# Patient Record
Sex: Female | Born: 1945 | Race: White | Hispanic: No | Marital: Married | Smoking: Former smoker
Health system: Southern US, Community
[De-identification: ages and names within clinical notes are randomized; demographics above are authoritative.]

## PROBLEM LIST (undated history)

## (undated) DIAGNOSIS — F419 Anxiety disorder, unspecified: Secondary | ICD-10-CM

## (undated) DIAGNOSIS — Z789 Other specified health status: Secondary | ICD-10-CM

## (undated) DIAGNOSIS — Z872 Personal history of diseases of the skin and subcutaneous tissue: Secondary | ICD-10-CM

## (undated) DIAGNOSIS — N63 Unspecified lump in unspecified breast: Secondary | ICD-10-CM

## (undated) DIAGNOSIS — N816 Rectocele: Secondary | ICD-10-CM

## (undated) DIAGNOSIS — K219 Gastro-esophageal reflux disease without esophagitis: Secondary | ICD-10-CM

## (undated) DIAGNOSIS — Z8489 Family history of other specified conditions: Secondary | ICD-10-CM

## (undated) DIAGNOSIS — G5600 Carpal tunnel syndrome, unspecified upper limb: Secondary | ICD-10-CM

## (undated) DIAGNOSIS — H269 Unspecified cataract: Secondary | ICD-10-CM

## (undated) DIAGNOSIS — Z1211 Encounter for screening for malignant neoplasm of colon: Secondary | ICD-10-CM

## (undated) DIAGNOSIS — Z87440 Personal history of urinary (tract) infections: Secondary | ICD-10-CM

## (undated) DIAGNOSIS — S0300XA Dislocation of jaw, unspecified side, initial encounter: Secondary | ICD-10-CM

## (undated) DIAGNOSIS — M1711 Unilateral primary osteoarthritis, right knee: Secondary | ICD-10-CM

## (undated) DIAGNOSIS — Z87891 Personal history of nicotine dependence: Secondary | ICD-10-CM

## (undated) DIAGNOSIS — E079 Disorder of thyroid, unspecified: Secondary | ICD-10-CM

## (undated) DIAGNOSIS — Z1389 Encounter for screening for other disorder: Secondary | ICD-10-CM

## (undated) HISTORY — DX: Personal history of diseases of the skin and subcutaneous tissue: Z87.2

## (undated) HISTORY — DX: Carpal tunnel syndrome, unspecified upper limb: G56.00

## (undated) HISTORY — DX: Encounter for screening for malignant neoplasm of colon: Z12.11

## (undated) HISTORY — DX: Personal history of nicotine dependence: Z87.891

## (undated) HISTORY — DX: Unspecified lump in unspecified breast: N63.0

## (undated) HISTORY — DX: Disorder of thyroid, unspecified: E07.9

## (undated) HISTORY — DX: Encounter for screening for other disorder: Z13.89

## (undated) HISTORY — DX: Personal history of urinary (tract) infections: Z87.440

---

## 1981-01-18 HISTORY — PX: DILATION AND CURETTAGE OF UTERUS: SHX78

## 1998-01-18 HISTORY — PX: CHOLECYSTECTOMY: SHX55

## 2004-01-19 DIAGNOSIS — G5600 Carpal tunnel syndrome, unspecified upper limb: Secondary | ICD-10-CM

## 2004-01-19 HISTORY — DX: Carpal tunnel syndrome, unspecified upper limb: G56.00

## 2004-01-19 HISTORY — PX: CARPAL TUNNEL RELEASE: SHX101

## 2008-01-19 DIAGNOSIS — N63 Unspecified lump in unspecified breast: Secondary | ICD-10-CM

## 2008-01-19 DIAGNOSIS — Z872 Personal history of diseases of the skin and subcutaneous tissue: Secondary | ICD-10-CM

## 2008-01-19 HISTORY — DX: Personal history of diseases of the skin and subcutaneous tissue: Z87.2

## 2008-01-19 HISTORY — PX: INCISION AND DRAINAGE BREAST ABSCESS: SUR672

## 2008-01-19 HISTORY — DX: Unspecified lump in unspecified breast: N63.0

## 2008-01-19 HISTORY — PX: TUBAL LIGATION: SHX77

## 2009-01-18 HISTORY — PX: COLONOSCOPY: SHX174

## 2010-01-18 DIAGNOSIS — E079 Disorder of thyroid, unspecified: Secondary | ICD-10-CM

## 2010-01-18 HISTORY — DX: Disorder of thyroid, unspecified: E07.9

## 2010-07-28 ENCOUNTER — Ambulatory Visit: Payer: Self-pay | Admitting: Gastroenterology

## 2011-07-07 ENCOUNTER — Emergency Department: Payer: Self-pay | Admitting: *Deleted

## 2011-07-07 LAB — COMPREHENSIVE METABOLIC PANEL
Albumin: 3.9 g/dL (ref 3.4–5.0)
Alkaline Phosphatase: 67 U/L (ref 50–136)
Anion Gap: 10 (ref 7–16)
Bilirubin,Total: 0.4 mg/dL (ref 0.2–1.0)
Chloride: 104 mmol/L (ref 98–107)
EGFR (Non-African Amer.): >60
Osmolality: 282 (ref 275–301)
Potassium: 4.1 mmol/L (ref 3.5–5.1)
SGPT (ALT): 27 U/L

## 2011-07-07 LAB — URINALYSIS, COMPLETE
Blood: NEGATIVE
Glucose,UR: NEGATIVE mg/dL (ref 0–75)
Nitrite: NEGATIVE
Protein: 30
RBC,UR: 3 /HPF (ref 0–5)
Specific Gravity: 1.027 (ref 1.003–1.030)
Squamous Epithelial: <1
WBC UR: 4 /HPF (ref 0–5)

## 2011-07-07 LAB — CBC
HCT: 40.9 % (ref 35.0–47.0)
HGB: 13.9 g/dL (ref 12.0–16.0)
MCH: 31.8 pg (ref 26.0–34.0)
WBC: 6.2 10*3/uL (ref 3.6–11.0)

## 2012-03-11 ENCOUNTER — Encounter: Payer: Self-pay | Admitting: *Deleted

## 2012-04-25 DIAGNOSIS — E042 Nontoxic multinodular goiter: Secondary | ICD-10-CM | POA: Insufficient documentation

## 2012-04-27 ENCOUNTER — Ambulatory Visit (INDEPENDENT_AMBULATORY_CARE_PROVIDER_SITE_OTHER): Payer: Medicare Other | Admitting: General Surgery

## 2012-04-27 ENCOUNTER — Encounter: Payer: Self-pay | Admitting: General Surgery

## 2012-04-27 VITALS — BP 120/86 | HR 82 | Resp 16 | Ht 66.0 in | Wt 177.0 lb

## 2012-04-27 DIAGNOSIS — E041 Nontoxic single thyroid nodule: Secondary | ICD-10-CM

## 2012-04-27 NOTE — Patient Instructions (Addendum)
Advised that right thyroid nodule is smaller in size. Will reevaluated in a year along with breast exam.

## 2012-04-27 NOTE — Progress Notes (Signed)
Patient ID: Sonia Huang, female   DOB: [birth year], 67 y.o.   MRN: [id number]  Chief Complaint  Patient presents with  . Other    thyroid     HPI Sonia Huang is a 67 y.o. female who presents for a follow up thyroid ultrasound. She was last seen 01/2011 where a thyroid nodule was aspirated which was benign. She states she is doing well and having no problems at this time.  HPI  Past Medical History  Diagnosis Date  . Lump or mass in breast 2010    LEFT   . Thyroid disease     Past Surgical History  Procedure Laterality Date  . Tubal ligation  2010  . Dilation and curettage of uterus  1983  . Carpal tunnel release Right 2006  . Cholecystectomy  2000  . Colonoscopy  2011  . Incision and drainage breast abscess Left 2010    History reviewed. No pertinent family history.  Social History History  Substance Use Topics  . Smoking status: Former Smoker -- 2.00 packs/day for 40 years  . Smokeless tobacco: Not on file  . Alcohol Use: Yes    No Known Allergies  Current Outpatient Prescriptions  Medication Sig Dispense Refill  . aspirin 81 MG tablet Take 81 mg by mouth daily.      . Calcium Carbonate-Vitamin D (CALCIUM + D PO) Take 1,000 mg by mouth daily.      Marland Kitchen escitalopram (LEXAPRO) 20 MG tablet Take 20 mg by mouth daily.      . Multiple Vitamin (MULTIVITAMIN) tablet Take 1 tablet by mouth daily.      . Omega-3 Fatty Acids (FISH OIL PO) Take 2 capsules by mouth daily.      . simvastatin (ZOCOR) 20 MG tablet Take 20 mg by mouth every evening.       No current facility-administered medications for this visit.    Review of Systems Review of Systems  Constitutional: Negative.   Respiratory: Negative.   Cardiovascular: Negative.     Blood pressure 120/86, pulse 82, resp. rate 16, height 5\' 6"  (1.676 m), weight 177 lb (80.287 kg).  Physical Exam Physical Exam  Constitutional: She appears well-developed and well-nourished.  Eyes: Conjunctivae are normal. No scleral  icterus.  Neck: No mass and no thyromegaly present.  Previously palpable right thyroid lobe mass is barely noticeable today.   Cardiovascular: Normal rate, regular rhythm, normal heart sounds and normal pulses.   No murmur heard. Pulmonary/Chest: Effort normal and breath sounds normal.    Data Reviewed Thyroid US shows right lobe nodule to stable to smaller.  Assessment    Benign right thyroid lobe nodule     Plan    Continue periodic monitoring.        Greta Doom F 04/27/2012, 2:13 PM

## 2012-04-28 ENCOUNTER — Other Ambulatory Visit: Payer: Self-pay

## 2012-05-30 ENCOUNTER — Encounter: Payer: Self-pay | Admitting: *Deleted

## 2012-05-30 DIAGNOSIS — Z872 Personal history of diseases of the skin and subcutaneous tissue: Secondary | ICD-10-CM | POA: Insufficient documentation

## 2012-08-01 ENCOUNTER — Ambulatory Visit: Payer: Self-pay | Admitting: Family Medicine

## 2012-08-02 ENCOUNTER — Ambulatory Visit: Payer: Self-pay | Admitting: General Surgery

## 2012-08-04 ENCOUNTER — Encounter: Payer: Self-pay | Admitting: General Surgery

## 2012-08-22 ENCOUNTER — Encounter: Payer: Self-pay | Admitting: General Surgery

## 2012-08-22 ENCOUNTER — Ambulatory Visit (INDEPENDENT_AMBULATORY_CARE_PROVIDER_SITE_OTHER): Payer: Medicare Other | Admitting: General Surgery

## 2012-08-22 VITALS — BP 130/64 | HR 90 | Resp 14 | Ht 64.0 in | Wt 175.0 lb

## 2012-08-22 DIAGNOSIS — Z1239 Encounter for other screening for malignant neoplasm of breast: Secondary | ICD-10-CM | POA: Insufficient documentation

## 2012-08-22 DIAGNOSIS — E041 Nontoxic single thyroid nodule: Secondary | ICD-10-CM

## 2012-08-22 NOTE — Patient Instructions (Addendum)
Patient to return 1 year bilateral  screening mammogram and thyroid u/s in office.

## 2012-08-22 NOTE — Progress Notes (Signed)
Patient ID: Sonia Huang, female   DOB: 22-May-1945, 67 y.o.   MRN: [id number]  Chief Complaint  Patient presents with  . Follow-up    mammogram    HPI Sonia Huang is a 67 y.o. female who presents for a breast evaluation. The most recent mammogram was done on 08/08/12 at [facility]  Patient does perform regular self breast checks and gets regular mammograms done.    HPI  Past Medical History  Diagnosis Date  . Lump or mass in breast 2010    LEFT   . Carpal tunnel syndrome 2006  . Personal history of tobacco use, presenting hazards to health   . Hx of cystitis   . Special screening for malignant neoplasms, colon   . 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 Screening for obesity   . Hx of abscess of breast 2010    Past Surgical History  Procedure Laterality Date  . Tubal ligation  2010  . Dilation and curettage of uterus  1983  . Carpal tunnel release Right 2006  . Cholecystectomy  2000  . Colonoscopy  2011  . Incision and drainage breast abscess Left 2010    History reviewed. No pertinent family history.  Social History History  Substance Use Topics  . Smoking status: Former Smoker -- 2.00 packs/day for 40 years  . Smokeless tobacco: Never Used  . Alcohol Use: Yes    No Known Allergies  Current Outpatient Prescriptions  Medication Sig Dispense Refill  . aspirin 81 MG tablet Take 81 mg by mouth daily.      . Calcium Carbonate-Vitamin D (CALCIUM + D PO) Take 1,000 mg by mouth daily.      Marland Kitchen escitalopram (LEXAPRO) 20 MG tablet Take 20 mg by mouth daily.      . Multiple Vitamin (MULTIVITAMIN) tablet Take 1 tablet by mouth daily.      . Omega-3 Fatty Acids (FISH OIL PO) Take 2 capsules by mouth daily.      . simvastatin (ZOCOR) 20 MG tablet Take 20 mg by mouth every evening.       No current facility-administered medications for this visit.    Review of Systems Review of Systems  Constitutional: Negative.   Respiratory: Negative.   Cardiovascular:  Negative.     Blood pressure 130/64, pulse 90, resp. rate 14, height 5\' 4"  (1.626 m), weight 175 lb (79.379 kg).  Physical Exam Physical Exam  Constitutional: She is oriented to person, place, and time. She appears well-developed and well-nourished.  Eyes: Conjunctivae are normal. No scleral icterus.  Neck: Neck supple. No mass and no thyromegaly present.  Cardiovascular: Normal rate, regular rhythm and normal heart sounds.   Pulmonary/Chest: Right breast exhibits no inverted nipple, no mass, no nipple discharge, no skin change and no tenderness. Left breast exhibits no inverted nipple, no mass, no nipple discharge, no skin change and no tenderness.  Abdominal: Soft. Normal appearance and bowel sounds are normal. There is no hepatomegaly. There is no tenderness. No hernia.  Lymphadenopathy:    She has no cervical adenopathy.    She has no axillary adenopathy.  Neurological: She is alert and oriented to person, place, and time.  Skin: Skin is warm and dry.    Data Reviewed Mammogram reviewed  Assessment    Stable exam     Plan    Patient to return 1 year bilateral  screening mammogram and thyroid u/s in office.        Sonia Huang G  08/22/2012, 9:16 PM

## 2013-05-01 DIAGNOSIS — N6009 Solitary cyst of unspecified breast: Secondary | ICD-10-CM | POA: Insufficient documentation

## 2013-05-01 DIAGNOSIS — Z789 Other specified health status: Secondary | ICD-10-CM | POA: Insufficient documentation

## 2013-08-21 ENCOUNTER — Ambulatory Visit: Payer: Medicare Other | Admitting: General Surgery

## 2013-09-05 ENCOUNTER — Encounter: Payer: Self-pay | Admitting: General Surgery

## 2013-09-19 ENCOUNTER — Ambulatory Visit (INDEPENDENT_AMBULATORY_CARE_PROVIDER_SITE_OTHER): Payer: Medicare Other | Admitting: General Surgery

## 2013-09-19 ENCOUNTER — Ambulatory Visit: Payer: Medicare Other

## 2013-09-19 ENCOUNTER — Encounter: Payer: Self-pay | Admitting: General Surgery

## 2013-09-19 VITALS — BP 136/62 | HR 78 | Resp 12 | Ht 64.0 in | Wt 172.0 lb

## 2013-09-19 DIAGNOSIS — E041 Nontoxic single thyroid nodule: Secondary | ICD-10-CM

## 2013-09-19 DIAGNOSIS — Z1239 Encounter for other screening for malignant neoplasm of breast: Secondary | ICD-10-CM

## 2013-09-19 NOTE — Progress Notes (Signed)
Patient ID: Kechia Yahnke, female   DOB: 02-05-1945, 68 y.o.   MRN: [id number]  Chief Complaint  Patient presents with  . Follow-up    follow up mammogram and thyroid ultrasound    HPI Briar Lawlor is a 68 y.o. female who presents for a breast evaluation. The most recent mammogram was done on 09/04/13. Patient does perform regular self breast checks and gets regular mammograms done.  The patient denies any problems with the breasts at this time. She is also here for a thyroid ultrasound. She denies any problems with her thyroid at this time.    HPI  Past Medical History  Diagnosis Date  . Lump or mass in breast 2010    LEFT   . Carpal tunnel syndrome 2006  . Personal history of tobacco use, presenting hazards to health   . Hx of cystitis   . Special screening for malignant neoplasms, colon   . 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 Screening for obesity   . Hx of abscess of breast 2010    Past Surgical History  Procedure Laterality Date  . Tubal ligation  2010  . Dilation and curettage of uterus  1983  . Carpal tunnel release Right 2006  . Cholecystectomy  2000  . Colonoscopy  2011  . Incision and drainage breast abscess Left 2010    Family History  Problem Relation Age of Onset  . Cancer Sister     mouth    Social History History  Substance Use Topics  . Smoking status: Former Smoker -- 2.00 packs/day for 40 years  . Smokeless tobacco: Never Used  . Alcohol Use: Yes    No Known Allergies  Current Outpatient Prescriptions  Medication Sig Dispense Refill  . aspirin 81 MG tablet Take 81 mg by mouth daily.      . Calcium Carbonate-Vitamin D (CALCIUM + D PO) Take 1,000 mg by mouth daily.      Marland Kitchen escitalopram (LEXAPRO) 20 MG tablet Take 20 mg by mouth daily.      . Omega-3 Fatty Acids (FISH OIL PO) Take 2 capsules by mouth daily.      . simvastatin (ZOCOR) 20 MG tablet Take 20 mg by mouth every evening.       No current facility-administered  medications for this visit.    Review of Systems Review of Systems  Constitutional: Negative.   Respiratory: Negative.   Cardiovascular: Negative.     Blood pressure 136/62, pulse 78, resp. rate 12, height  (1.626 m), weight 172 lb (78.019 kg).  Physical Exam Physical Exam  Constitutional: She is oriented to person, place, and time. She appears well-developed and well-nourished.  Eyes: Conjunctivae are normal. No scleral icterus.  Neck: Neck supple. No mass and no thyromegaly present.  Cardiovascular: Normal rate, regular rhythm and normal heart sounds.   No murmur heard. Pulmonary/Chest: Effort normal and breath sounds normal. Right breast exhibits no inverted nipple, no mass, no nipple discharge, no skin change and no tenderness. Left breast exhibits no inverted nipple, no mass, no nipple discharge, no skin change and no tenderness.  Lymphadenopathy:    She has no cervical adenopathy.    She has no axillary adenopathy.  Neurological: She is alert and oriented to person, place, and time.  Skin: Skin is warm and dry.    Data Reviewed Mammogram stable. Prior thyroid ultrasound reviewed. Ultrasound today showed size of right thyroid nodule at 1.46 cm. It measured 1.9 cm 2  years ago.   Assessment    Stable breast exam. Stable to smaller right thyroid nodule. Benign by prior FNA.     Plan    Patient to follow up in 1 year with thyroid ultrasound and breast exam.        SANKAR,SEEPLAPUTHUR G 09/20/2013, 12:09 PM

## 2013-09-19 NOTE — Patient Instructions (Signed)
Patient to return in 1 year for follow up.Continue self breast exams. Call office for any new breast issues or concerns.  

## 2013-09-20 ENCOUNTER — Encounter: Payer: Self-pay | Admitting: General Surgery

## 2013-11-15 DIAGNOSIS — H25819 Combined forms of age-related cataract, unspecified eye: Secondary | ICD-10-CM | POA: Insufficient documentation

## 2013-11-15 DIAGNOSIS — L309 Dermatitis, unspecified: Secondary | ICD-10-CM | POA: Insufficient documentation

## 2013-11-15 DIAGNOSIS — M792 Neuralgia and neuritis, unspecified: Secondary | ICD-10-CM | POA: Insufficient documentation

## 2013-11-15 DIAGNOSIS — N816 Rectocele: Secondary | ICD-10-CM | POA: Insufficient documentation

## 2013-11-15 DIAGNOSIS — R202 Paresthesia of skin: Secondary | ICD-10-CM | POA: Insufficient documentation

## 2013-11-15 DIAGNOSIS — R351 Nocturia: Secondary | ICD-10-CM | POA: Insufficient documentation

## 2013-11-15 DIAGNOSIS — Z780 Asymptomatic menopausal state: Secondary | ICD-10-CM | POA: Insufficient documentation

## 2013-11-15 DIAGNOSIS — N8110 Cystocele, unspecified: Secondary | ICD-10-CM | POA: Insufficient documentation

## 2013-11-15 DIAGNOSIS — R002 Palpitations: Secondary | ICD-10-CM | POA: Insufficient documentation

## 2013-11-19 ENCOUNTER — Encounter: Payer: Self-pay | Admitting: General Surgery

## 2014-07-17 ENCOUNTER — Other Ambulatory Visit: Payer: Self-pay

## 2014-07-17 DIAGNOSIS — Z1239 Encounter for other screening for malignant neoplasm of breast: Secondary | ICD-10-CM

## 2014-09-17 ENCOUNTER — Ambulatory Visit (INDEPENDENT_AMBULATORY_CARE_PROVIDER_SITE_OTHER): Payer: Medicare Other | Admitting: General Surgery

## 2014-09-17 ENCOUNTER — Encounter: Payer: Self-pay | Admitting: General Surgery

## 2014-09-17 ENCOUNTER — Ambulatory Visit: Payer: Medicare Other

## 2014-09-17 VITALS — BP 124/70 | HR 64 | Resp 14 | Ht 64.0 in | Wt 168.0 lb

## 2014-09-17 DIAGNOSIS — E041 Nontoxic single thyroid nodule: Secondary | ICD-10-CM

## 2014-09-17 DIAGNOSIS — Z1231 Encounter for screening mammogram for malignant neoplasm of breast: Secondary | ICD-10-CM | POA: Diagnosis not present

## 2014-09-17 NOTE — Progress Notes (Signed)
Patient ID: Sonia Huang, female   DOB: 03/23/1945, 69 y.o.   MRN: [id number]  Chief Complaint  Patient presents with  . Follow-up    Bilateral Mammogram     HPI Sonia Huang is a 69 y.o. female. Patient is here for yearly mammograms and thyroid check. Recent mammogram was on 09/09/2014. No recent problems on her breasts. Patient does not do her monthly breast exams. Labs were drawn at [facility] on January 2016. HPI  Past Medical History  Diagnosis Date  . Lump or mass in breast 2010    LEFT   . Carpal tunnel syndrome 2006  . Personal history of tobacco use, presenting hazards to health   . Hx of cystitis   . Special screening for malignant neoplasms, colon   . 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 Screening for obesity   . Hx of abscess of breast 2010    Past Surgical History  Procedure Laterality Date  . Tubal ligation  2010  . Dilation and curettage of uterus  1983  . Carpal tunnel release Right 2006  . Cholecystectomy  2000  . Colonoscopy  2011  . Incision and drainage breast abscess Left 2010    Family History  Problem Relation Age of Onset  . Cancer Sister     mouth    Social History Social History  Substance Use Topics  . Smoking status: Former Smoker -- 2.00 packs/day for 40 years  . Smokeless tobacco: Never Used  . Alcohol Use: Yes    No Known Allergies  Current Outpatient Prescriptions  Medication Sig Dispense Refill  . aspirin 81 MG tablet Take 81 mg by mouth daily.    . Calcium Carbonate-Vitamin D (CALCIUM + D PO) Take 1,000 mg by mouth daily.    Marland Kitchen escitalopram (LEXAPRO) 20 MG tablet Take 20 mg by mouth daily.    . simvastatin (ZOCOR) 20 MG tablet Take 20 mg by mouth every evening.     No current facility-administered medications for this visit.    Review of Systems Review of Systems  Constitutional: Negative.   Respiratory: Negative.   Cardiovascular: Negative.     Blood pressure 124/70, pulse 64, resp. rate 14, height   (1.626 m), weight 168 lb (76.204 kg).  Physical Exam Physical Exam  Constitutional: She is oriented to person, place, and time. She appears well-developed and well-nourished.  Eyes: Conjunctivae are normal. No scleral icterus.  Neck: Neck supple. No tracheal deviation present. No thyromegaly present.  Cardiovascular: Normal rate, regular rhythm and normal heart sounds.   Pulmonary/Chest: Effort normal and breath sounds normal. Right breast exhibits no inverted nipple, no mass, no nipple discharge, no skin change and no tenderness. Left breast exhibits no inverted nipple, no mass, no nipple discharge, no skin change and no tenderness.  Abdominal: Soft. Bowel sounds are normal.  Lymphadenopathy:    She has no cervical adenopathy.    She has no axillary adenopathy.  Neurological: She is alert and oriented to person, place, and time.  Skin: Skin is warm and dry.  Psychiatric: She has a normal mood and affect.    Data Reviewed  Mammogram reviewed and stable. Thyroid U/S- benign appearing stable right nodule. Measures 1.89 cm. Assessment    Stable exam. Right thyroid nodule neg on prior FNA, stable and asymptomatic Plan    F/U one year. Screening Mammogram and Thyroid U/S.    PCP: Dr. Cena Benton 09/17/2014, 9:46 AM

## 2014-09-17 NOTE — Patient Instructions (Addendum)
Follow up in one year. Call for any problems related to her breast or thyroid area

## 2015-09-16 ENCOUNTER — Encounter: Payer: Self-pay | Admitting: General Surgery

## 2015-09-24 ENCOUNTER — Encounter: Payer: Self-pay | Admitting: *Deleted

## 2015-09-24 ENCOUNTER — Encounter: Payer: Self-pay | Admitting: General Surgery

## 2015-09-30 ENCOUNTER — Other Ambulatory Visit: Payer: Self-pay

## 2015-09-30 ENCOUNTER — Encounter: Payer: Self-pay | Admitting: General Surgery

## 2015-09-30 ENCOUNTER — Ambulatory Visit (INDEPENDENT_AMBULATORY_CARE_PROVIDER_SITE_OTHER): Payer: Medicare Other | Admitting: General Surgery

## 2015-09-30 VITALS — BP 112/60 | HR 76 | Resp 12 | Ht 64.0 in | Wt 179.0 lb

## 2015-09-30 DIAGNOSIS — E041 Nontoxic single thyroid nodule: Secondary | ICD-10-CM | POA: Diagnosis not present

## 2015-09-30 DIAGNOSIS — Z9889 Other specified postprocedural states: Secondary | ICD-10-CM

## 2015-09-30 NOTE — Patient Instructions (Addendum)
The patient has been asked to return to the office in one year with a bilateral screeningmammogram and one year thyroid ultrsound

## 2015-09-30 NOTE — Progress Notes (Addendum)
Patient ID: Sonia Huang, female   DOB: 03-07-1945, 70 y.o.   MRN: [id number]  Chief Complaint  Patient presents with  . Follow-up    mammogram    HPI Sonia Huang is a 70 y.o. female who presents for a breast check and thyroid ultrasound. The most recent mammogram was done on 09/19/15. Last thyroid ultrasound was 1 year ago. Patient does perform regular self breast checks and gets regular mammograms done. Denies any symptoms with her breasts or her thyroid.  I have reviewed the history of present illness with the patient.    Marland KitchenHPI  Past Medical History:  Diagnosis Date  . Carpal tunnel syndrome 2006  . Hx of abscess of breast 2010  . Hx of cystitis   . Lump or mass in breast 2010   LEFT   . Personal history of tobacco use, presenting hazards to health   . Screening for obesity   . Special screening for malignant neoplasms, colon   . 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 CARPAL TUNNEL RELEASE Right 2006  . CHOLECYSTECTOMY  2000  . COLONOSCOPY  2011  . DILATION AND CURETTAGE OF UTERUS  1983  . INCISION AND DRAINAGE BREAST ABSCESS Left 2010  . TUBAL LIGATION  2010    Family History  Problem Relation Age of Onset  . Cancer Sister     mouth    Social History Social History  Substance Use Topics  . Smoking status: Former Smoker    Packs/day: 2.00    Years: 40.00  . Smokeless tobacco: Never Used  . Alcohol use Yes    No Known Allergies  Current Outpatient Prescriptions  Medication Sig Dispense Refill  . aspirin 81 MG tablet Take 81 mg by mouth daily.    . Calcium Carbonate-Vitamin D (CALCIUM + D PO) Take 1,200 mg by mouth daily.     Marland Kitchen escitalopram (LEXAPRO) 20 MG tablet Take 20 mg by mouth daily.    . simvastatin (ZOCOR) 20 MG tablet Take 20 mg by mouth every evening.     No current facility-administered medications for this visit.     Review of Systems Review of Systems  Constitutional:  Negative.   Respiratory: Negative.   Cardiovascular: Negative.     Blood pressure 112/60, pulse 76, resp. rate 12, height 5\' 4"  (1.626 m), weight 179 lb (81.2 kg).  Physical Exam Physical Exam  Constitutional: She is oriented to person, place, and time. She appears well-developed and well-nourished.  Eyes: No scleral icterus.  Neck: Normal range of motion. Neck supple. No thyromegaly present.  Cardiovascular: Normal rate and regular rhythm.   Pulmonary/Chest: Effort normal and breath sounds normal. No respiratory distress. She has no wheezes. She has no rales. Right breast exhibits no inverted nipple, no mass, no nipple discharge, no skin change and no tenderness. Left breast exhibits no inverted nipple, no mass, no nipple discharge, no skin change and no tenderness.  Abdominal: Soft. Bowel sounds are normal. There is no tenderness.  Lymphadenopathy:    She has no cervical adenopathy.    She has no axillary adenopathy.  Neurological: She is alert and oriented to person, place, and time.  Skin: Skin is warm and dry.    Data Reviewed Mammogram reviewed and stable. Thyroid US repeated today, showing the same smooth homogeneous mass in right lobe measuring 2.0 cm.  Assessment    Stable exam. Thyroid nodule measuring 2.0 cm in  length on US compared to 1.9 cm measurement on US completed 1 year ago. No concerns at this time.  Plan   The patient has been asked to return to the office in one year with a bilateral screening mammogram and one year thyroid ultrasound.     This information has been scribed by Sonia SpecterJessica Huang CMA.   Sonia Huang G 10/14/2015, 9:29 AM

## 2015-10-08 ENCOUNTER — Ambulatory Visit
Admission: EM | Admit: 2015-10-08 | Discharge: 2015-10-08 | Disposition: A | Discharge status: Home / Self Care | Payer: Medicare Other | Attending: Family Medicine | Admitting: Family Medicine

## 2015-10-08 DIAGNOSIS — M26629 Arthralgia of temporomandibular joint, unspecified side: Secondary | ICD-10-CM | POA: Diagnosis not present

## 2015-10-08 NOTE — ED Triage Notes (Signed)
Patient c/o left ear pain, left jaw pain and left shoulder pain when she takes a deep breath.

## 2015-10-08 NOTE — ED Provider Notes (Signed)
[facility]    CSN: [id number] Arrival date & time: 10/08/15  1316  First Provider Contact:  None       History   Chief Complaint Chief Complaint  Patient presents with  . Otalgia    HPI Tanessa Bruna Pottereague is a 70 y.o. female.   The history is provided by the patient.  Otalgia  Location:  Left Behind ear:  No abnormality Quality:  Aching and dull Severity:  Moderate Onset quality:  Sudden Timing:  Constant Progression:  Waxing and waning Chronicity:  New Associated symptoms: no abdominal pain, no congestion, no cough, no diarrhea, no ear discharge, no fever, no headaches, no hearing loss, no neck pain, no rash, no rhinorrhea, no sore throat, no tinnitus and no vomiting   Risk factors: no recent travel, no chronic ear infection and no prior ear surgery     Past Medical History:  Diagnosis Date  . Carpal tunnel syndrome 2006  . Hx of abscess of breast 2010  . Hx of cystitis   . Lump or mass in breast 2010   LEFT   . Personal history of tobacco use, presenting hazards to health   . Screening for obesity   . Special screening for malignant neoplasms, colon   . 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 Thyroid nodule 04/27/2012    Past Surgical History:  Procedure Laterality Date  . CARPAL TUNNEL RELEASE Right 2006  . CHOLECYSTECTOMY  2000  . COLONOSCOPY  2011  . DILATION AND CURETTAGE OF UTERUS  1983  . INCISION AND DRAINAGE BREAST ABSCESS Left 2010  . TUBAL LIGATION  2010    OB History    Gravida Para Term Preterm AB Living   1 1       1    SAB TAB Ectopic Multiple Live Births                  Obstetric Comments   FIRST PREGNANCY 22 FIRST MENSTRUAL 14 LMP 1995       Home Medications    Prior to Admission medications   Medication Sig Start Date End Date Taking? Authorizing Provider  aspirin 81 MG tablet Take 81 mg by mouth daily.   Yes Historical Provider, MD  Calcium  Carbonate-Vitamin D (CALCIUM + D PO) Take 1,200 mg by mouth daily.    Yes Historical Provider, MD  escitalopram (LEXAPRO) 20 MG tablet Take 20 mg by mouth daily.   Yes Historical Provider, MD  simvastatin (ZOCOR) 20 MG tablet Take 20 mg by mouth every evening.   Yes Historical Provider, MD    Family History Family History  Problem Relation Age of Onset  . Cancer Sister     mouth    Social History Social History  Substance Use Topics  . Smoking status: Former Smoker    Packs/day: 2.00    Years: 40.00  . Smokeless tobacco: Never Used  . Alcohol use Yes     Allergies   Review of patient's allergies indicates no known allergies.   Review of Systems Review of Systems  Constitutional: Negative for fever.  HENT: Positive for ear pain. Negative for congestion, ear discharge, hearing loss, rhinorrhea, sore throat and tinnitus.   Respiratory: Negative for cough.   Gastrointestinal: Negative for abdominal pain, diarrhea and vomiting.  Musculoskeletal: Negative for neck pain.  Skin: Negative for rash.  Neurological: Negative for headaches.  Physical Exam Triage Vital Signs ED Triage Vitals  Enc Vitals Group     BP 10/08/15 1337 119/65     Pulse Rate 10/08/15 1337 72     Resp 10/08/15 1337 18     Temp 10/08/15 1337 98.6 F (37 C)     Temp Source 10/08/15 1337 Oral     SpO2 10/08/15 1337 97 %     Weight 10/08/15 1336 166 lb (75.3 kg)     Height 10/08/15 1336 5\' 4"  (1.626 m)     Head Circumference --      Peak Flow --      Pain Score 10/08/15 1337 3     Pain Loc --      Pain Edu? --      Excl. in GC? --    No data found.   Updated Vital Signs BP 119/65 (BP Location: Left Arm)   Pulse 72   Temp 98.6 F (37 C) (Oral)   Resp 18   Ht 5\' 4"  (1.626 m)   Wt 166 lb (75.3 kg)   SpO2 97%   BMI 28.49 kg/m   Visual Acuity Right Eye Distance:   Left Eye Distance:   Bilateral Distance:    Right Eye Near:   Left Eye Near:    Bilateral Near:     Physical Exam    Constitutional: She appears well-developed and well-nourished. No distress.  HENT:  Head: Normocephalic and atraumatic.  Right Ear: Tympanic membrane, external ear and ear canal normal.  Left Ear: Tympanic membrane, external ear and ear canal normal.  Nose: No mucosal edema, rhinorrhea, nose lacerations, sinus tenderness, nasal deformity, septal deviation or nasal septal hematoma. No epistaxis.  No foreign bodies. Right sinus exhibits no maxillary sinus tenderness and no frontal sinus tenderness. Left sinus exhibits no maxillary sinus tenderness and no frontal sinus tenderness.  Mouth/Throat: Uvula is midline, oropharynx is clear and moist and mucous membranes are normal. No oropharyngeal exudate.  Eyes: Conjunctivae and EOM are normal. Pupils are equal, round, and reactive to light. Right eye exhibits no discharge. Left eye exhibits no discharge. No scleral icterus.  Neck: Trachea normal and normal range of motion. Neck supple. No thyroid mass and no thyromegaly present.  Cardiovascular: Normal rate, regular rhythm and normal heart sounds.   Pulmonary/Chest: Effort normal and breath sounds normal. No respiratory distress. She has no wheezes. She has no rales.  Lymphadenopathy:    She has no cervical adenopathy.  Skin: She is not diaphoretic.  Nursing note and vitals reviewed.    UC Treatments / Results  Labs (all labs ordered are listed, but only abnormal results are displayed) Labs Reviewed - No data to display  EKG  EKG Interpretation None       Radiology No results found.  Procedures Procedures (including critical care time)  Medications Ordered in UC Medications - No data to display   Initial Impression / Assessment and Plan / UC Course  I have reviewed the triage vital signs and the nursing notes.  Pertinent labs & imaging results that were available during my care of the patient were reviewed by me and considered in my medical decision making (see chart for  details).  Clinical Course      Final Clinical Impressions(s) / UC Diagnoses   Final diagnoses:  TMJ syndrome    New Prescriptions Discharge Medication List as of 10/08/2015  1:49 PM     1. diagnosis reviewed with patient 2. rx as per orders above;  reviewed possible side effects, interactions, risks and benefits  3. Recommend supportive treatment with otc NSAIDS 4. Follow-up prn if symptoms worsen or don't improve   Payton Mccallum, MD 10/08/15 2053

## 2015-12-27 ENCOUNTER — Ambulatory Visit
Admission: EM | Admit: 2015-12-27 | Discharge: 2015-12-27 | Disposition: A | Discharge status: Home / Self Care | Payer: Medicare Other | Attending: Family Medicine | Admitting: Family Medicine

## 2015-12-27 DIAGNOSIS — J069 Acute upper respiratory infection, unspecified: Secondary | ICD-10-CM

## 2015-12-27 DIAGNOSIS — J209 Acute bronchitis, unspecified: Secondary | ICD-10-CM | POA: Diagnosis not present

## 2015-12-27 MED ORDER — AZITHROMYCIN 250 MG PO TABS: ORAL_TABLET | ORAL | 0 refills | Status: DC

## 2015-12-27 MED ORDER — ALBUTEROL SULFATE HFA 108 (90 BASE) MCG/ACT IN AERS: 2.0000 | INHALATION_SPRAY | RESPIRATORY_TRACT | 0 refills | Status: DC | PRN

## 2015-12-27 MED ORDER — HYDROCOD POLST-CPM POLST ER 10-8 MG/5ML PO SUER: 5.0000 mL | Freq: Two times a day (BID) | ORAL | 0 refills | Status: DC | PRN

## 2015-12-27 MED ORDER — FEXOFENADINE-PSEUDOEPHED ER 180-240 MG PO TB24: 1.0000 | ORAL_TABLET | Freq: Every day | ORAL | 0 refills | Status: DC

## 2015-12-27 NOTE — ED Provider Notes (Signed)
[facility]    CSN: [id number] Arrival date & time: 12/27/15  1104     History   Chief Complaint Chief Complaint  Patient presents with  . Cough    HPI Sonia Huang is a 70 y.o. female.   Patient's here because of cough and congestion which started about 4-5 days ago. She had low-grade fever and myalgia and sore throat and marked nasal congestion. Sore throat has improved the myalgias and fever have improved but now the cough and congestion has several internal lungs she started having some bronchospasm and productive cough now with before it is nonproductive. This is going been going on for about 4-5 days total however she strained to visit her grandson in [place] next week who he has immunosuppressive illness and has to get IV treatment sounds like immune globulins on a monthly basis to keep him well. She states her daughter who is a Engineer, civil (consulting)nurse is concerned that she may have had the flu. She is a former smoke she does drink a beer occasionally. She has history of thyroid nodule. She's had carpal release cholecystectomy colonoscopy tubal ligation D&C and I&D of a breast abscess in the past. No pertinent past family medical history relevant to today's visit.   The history is provided by the patient. No language interpreter was used.  Cough  Cough characteristics:  Productive Sputum characteristics:  Nondescript Severity:  Moderate Onset quality:  Sudden Duration:  4 days Progression:  Worsening Chronicity:  New Context: upper respiratory infection   Relieved by:  Cough suppressants Worsened by:  Nothing Ineffective treatments:  None tried Associated symptoms: fever, myalgias, sinus congestion and sore throat   Associated symptoms: no chest pain     Past Medical History:  Diagnosis Date  . Carpal tunnel syndrome 2006  . Hx of abscess of breast 2010  . Hx of cystitis   . Lump or mass in breast 2010   LEFT   . Personal history of tobacco use, presenting hazards to  health   . Screening for obesity   . Special screening for malignant neoplasms, colon   . 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 Thyroid nodule 04/27/2012    Past Surgical History:  Procedure Laterality Date  . CARPAL TUNNEL RELEASE Right 2006  . CHOLECYSTECTOMY  2000  . COLONOSCOPY  2011  . DILATION AND CURETTAGE OF UTERUS  1983  . INCISION AND DRAINAGE BREAST ABSCESS Left 2010  . TUBAL LIGATION  2010    OB History    Gravida Para Term Preterm AB Living   1 1       1    SAB TAB Ectopic Multiple Live Births                  Obstetric Comments   FIRST PREGNANCY 22 FIRST MENSTRUAL 14 LMP 1995       Home Medications    Prior to Admission medications   Medication Sig Start Date End Date Taking? Authorizing Provider  aspirin 81 MG tablet Take 81 mg by mouth daily.   Yes Historical Provider, MD  Calcium Carbonate-Vitamin D (CALCIUM + D PO) Take 1,200 mg by mouth daily.    Yes Historical Provider, MD  escitalopram (LEXAPRO) 20 MG tablet Take 20 mg by mouth daily.   Yes Historical Provider, MD  pravastatin (PRAVACHOL) 20 MG tablet Take 20 mg by mouth daily.   Yes Historical Provider,  MD  albuterol (PROVENTIL HFA;VENTOLIN HFA) 108 (90 Base) MCG/ACT inhaler Inhale 2 puffs into the lungs every 4 (four) hours as needed for wheezing or shortness of breath. 12/27/15   Hassan RowanEugene Khriz Liddy, MD  azithromycin (ZITHROMAX Z-PAK) 250 MG tablet Take 2 tablets first day and then 1 po a day for 4 days 12/27/15   Hassan RowanEugene Beverly Ferner, MD  chlorpheniramine-HYDROcodone [facility](TUSSIONEX PENNKINETIC ER) 10-8 MG/5ML SUER Take 5 mLs by mouth every 12 (twelve) hours as needed for cough. 12/27/15   Hassan RowanEugene Khadejah Son, MD  fexofenadine-pseudoephedrine (ALLEGRA-D ALLERGY & CONGESTION) 180-240 MG 24 hr tablet Take 1 tablet by mouth daily. 12/27/15   Hassan RowanEugene Jhaniya Briski, MD  simvastatin (ZOCOR) 20 MG tablet Take 20 mg by mouth every evening.    Historical Provider, MD    Family  History Family History  Problem Relation Age of Onset  . Cancer Sister     mouth    Social History Social History  Substance Use Topics  . Smoking status: Former Smoker    Packs/day: 2.00    Years: 40.00  . Smokeless tobacco: Never Used  . Alcohol use Yes     Allergies   Patient has no known allergies.   Review of Systems Review of Systems  Constitutional: Positive for fever.  HENT: Positive for sore throat.   Respiratory: Positive for cough.   Cardiovascular: Negative for chest pain.  Musculoskeletal: Positive for myalgias.  All other systems reviewed and are negative.    Physical Exam Triage Vital Signs ED Triage Vitals  Enc Vitals Group     BP 12/27/15 1133 (!) 138/57     Pulse Rate 12/27/15 1133 71     Resp 12/27/15 1133 18     Temp 12/27/15 1133 99 F (37.2 C)     Temp Source 12/27/15 1133 Oral     SpO2 12/27/15 1133 93 %     Weight 12/27/15 1131 168 lb (76.2 kg)     Height 12/27/15 1131 5\' 4"  (1.626 m)     Head Circumference --      Peak Flow --      Pain Score 12/27/15 1132 4     Pain Loc --      Pain Edu? --      Excl. in GC? --    No data found.   Updated Vital Signs BP (!) 138/57 (BP Location: Left Arm)   Pulse 71   Temp 99 F (37.2 C) (Oral)   Resp 18   Ht 5\' 4"  (1.626 m)   Wt 168 lb (76.2 kg)   SpO2 93%   BMI 28.84 kg/m   Visual Acuity Right Eye Distance:   Left Eye Distance:   Bilateral Distance:    Right Eye Near:   Left Eye Near:    Bilateral Near:     Physical Exam  Constitutional: She appears well-developed and well-nourished. No distress.  HENT:  Head: Normocephalic and atraumatic.  Right Ear: Hearing, tympanic membrane, external ear and ear canal normal.  Left Ear: Hearing, tympanic membrane, external ear and ear canal normal.  Nose: Mucosal edema and rhinorrhea present.  Mouth/Throat: Uvula is midline and mucous membranes are normal. Mucous membranes are not pale. Posterior oropharyngeal erythema present.  Eyes:  Pupils are equal, round, and reactive to light.  Neck: Normal range of motion.  Cardiovascular: Normal rate, regular rhythm and normal heart sounds.   Pulmonary/Chest: Effort normal and breath sounds normal.  Musculoskeletal: Normal range of motion.  Lymphadenopathy:    She has  cervical adenopathy.  Neurological: She is alert.  Skin: Skin is warm. She is not diaphoretic.  Psychiatric: She has a normal mood and affect.  Vitals reviewed.    UC Treatments / Results  Labs (all labs ordered are listed, but only abnormal results are displayed) Labs Reviewed - No data to display  EKG  EKG Interpretation None       Radiology No results found.  Procedures Procedures (including critical care time)  Medications Ordered in UC Medications - No data to display   Initial Impression / Assessment and Plan / UC Course  I have reviewed the triage vital signs and the nursing notes.  Pertinent labs & imaging results that were available during my care of the patient were reviewed by me and considered in my medical decision making (see chart for details).  Clinical Course     Explained to her that I think that she has had a flu or viral illness earlier. The myalgias cleared the fever is gone she is not committed candidate for Tamiflu however since she is going to be seeing her immunocompromise grandson and has had increasing cough now with bronchospasm in those cleared at this time I will place him a Z-Pak and Allegra-D Tussionex 1 teaspoon twice a day and albuterol inhaler. Expiratory and do a strep test obtained a flu test to make sure that these problems had not been present but she is comfortable she's taking the medication and going from there at this time. I'll PCP in week if not better.  Final Clinical Impressions(s) / UC Diagnoses   Final diagnoses:  Upper respiratory tract infection, unspecified type  Acute bronchitis, unspecified organism    New Prescriptions New  Prescriptions   ALBUTEROL (PROVENTIL HFA;VENTOLIN HFA) 108 (90 BASE) MCG/ACT INHALER    Inhale 2 puffs into the lungs every 4 (four) hours as needed for wheezing or shortness of breath.   AZITHROMYCIN (ZITHROMAX Z-PAK) 250 MG TABLET    Take 2 tablets first day and then 1 po a day for 4 days   CHLORPHENIRAMINE-HYDROCODONE (TUSSIONEX PENNKINETIC ER) 10-8 MG/5ML SUER    Take 5 mLs by mouth every 12 (twelve) hours as needed for cough.   FEXOFENADINE-PSEUDOEPHEDRINE (ALLEGRA-D ALLERGY & CONGESTION) 180-240 MG 24 HR TABLET    Take 1 tablet by mouth daily.     Note: This dictation was prepared with Dragon dictation along with smaller phrase technology. Any transcriptional errors that result from this process are unintentional.   Hassan Rowan, MD 12/27/15 1221

## 2015-12-27 NOTE — ED Triage Notes (Signed)
Patient complains of cough and congestion, fevers, tightness with breathing. Patient states that symptoms started on Monday. Patient states that she is going to visit her grandson next week who is immunosuppressed.

## 2016-05-29 ENCOUNTER — Ambulatory Visit (INDEPENDENT_AMBULATORY_CARE_PROVIDER_SITE_OTHER): Payer: Medicare Other

## 2016-05-29 ENCOUNTER — Ambulatory Visit
Admission: EM | Admit: 2016-05-29 | Discharge: 2016-05-29 | Disposition: A | Discharge status: Home / Self Care | Payer: Medicare Other | Attending: Family Medicine | Admitting: Family Medicine

## 2016-05-29 DIAGNOSIS — R0981 Nasal congestion: Secondary | ICD-10-CM

## 2016-05-29 DIAGNOSIS — R05 Cough: Secondary | ICD-10-CM | POA: Diagnosis not present

## 2016-05-29 DIAGNOSIS — J069 Acute upper respiratory infection, unspecified: Secondary | ICD-10-CM | POA: Diagnosis not present

## 2016-05-29 LAB — RAPID INFLUENZA A&B ANTIGENS
Influenza A (ARMC): NEGATIVE
Influenza B (ARMC): NEGATIVE

## 2016-05-29 LAB — RAPID STREP SCREEN (MED CTR MEBANE ONLY): Streptococcus, Group A Screen (Direct): NEGATIVE

## 2016-05-29 MED ORDER — HYDROCOD POLST-CPM POLST ER 10-8 MG/5ML PO SUER: 5.0000 mL | Freq: Every evening | ORAL | 0 refills | Status: DC | PRN

## 2016-05-29 MED ORDER — BENZONATATE 100 MG PO CAPS: 100.0000 mg | ORAL_CAPSULE | Freq: Three times a day (TID) | ORAL | 0 refills | Status: DC | PRN

## 2016-05-29 MED ORDER — ALBUTEROL SULFATE HFA 108 (90 BASE) MCG/ACT IN AERS: 2.0000 | INHALATION_SPRAY | RESPIRATORY_TRACT | 0 refills | Status: DC | PRN

## 2016-05-29 MED ORDER — IPRATROPIUM-ALBUTEROL 0.5-2.5 (3) MG/3ML IN SOLN
3.0000 mL | Freq: Once | RESPIRATORY_TRACT | Status: AC
  Administered 2016-05-29: 3 mL via RESPIRATORY_TRACT

## 2016-05-29 MED ORDER — ACETAMINOPHEN 500 MG PO TABS
1000.0000 mg | ORAL_TABLET | Freq: Once | ORAL | Status: AC
  Administered 2016-05-29: 1000 mg via ORAL

## 2016-05-29 MED ORDER — PREDNISONE 20 MG PO TABS: 40.0000 mg | ORAL_TABLET | Freq: Every day | ORAL | 0 refills | Status: DC

## 2016-05-29 MED ORDER — AZITHROMYCIN 250 MG PO TABS: ORAL_TABLET | ORAL | 0 refills | Status: DC

## 2016-05-29 NOTE — ED Provider Notes (Signed)
[facility] ____________________________________________  Time seen: Approximately 9:32 AM  I have reviewed the triage vital signs and the nursing notes.   HISTORY  Chief Complaint Cough  HPI Verneal Huang is a 71 y.o. female presenting for evaluation of 5 days of runny nose, nasal congestion, cough and chest congestion. Patient reports the last 2 days she has had some generalized body aches. States feels tired. Denies syncope or near syncope. Reports some itchy ears and sore throat, primarily sore throat with coughing. States cough is productive of thick yellow mucus. States some postnasal drainage. Denies known sick contacts. Patient reports that she did recently travel back from [place], and may have been around sick contacts. States that this was an quick onset. She reports has been taking some over-the-counter ibuprofen and cough congestion medications with some improvement. Patient denies chronic respiratory issues, COPD or asthma. Reports former smoker. Patient reports she has since generalized soreness as well as some soreness from coughing but denies chest pain or shortness of breath. Denies pain with deep breaths. No over the counter medications taken today.  Denies chest pain, shortness of breath, abdominal pain, hemoptysis, dysuria, extremity pain, extremity swelling or rash. Denies recent surgery. Denies immobilization. Denies cancer history. Denies recent sickness. Denies recent antibiotic use. No history of PE or DVT.  PCP: Dayna Barker   Past Medical History:  Diagnosis Date  . Carpal tunnel syndrome 2006  . Hx of abscess of breast 2010  . Hx of cystitis   . Lump or mass in breast 2010   LEFT   . Personal history of tobacco use, presenting hazards to health   . Screening for obesity   . Special screening for malignant neoplasms, colon   . 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 Thyroid nodule  04/27/2012    Past Surgical History:  Procedure Laterality Date  . CARPAL TUNNEL RELEASE Right 2006  . CHOLECYSTECTOMY  2000  . COLONOSCOPY  2011  . DILATION AND CURETTAGE OF UTERUS  1983  . INCISION AND DRAINAGE BREAST ABSCESS Left 2010  . TUBAL LIGATION  2010     No current facility-administered medications for this encounter.   Current Outpatient Prescriptions:  .  aspirin 81 MG tablet, Take 81 mg by mouth daily., Disp: , Rfl:  .  Calcium Carbonate-Vitamin D (CALCIUM + D PO), Take 1,200 mg by mouth daily. , Disp: , Rfl:  .  escitalopram (LEXAPRO) 20 MG tablet, Take 20 mg by mouth daily., Disp: , Rfl:  .  fexofenadine-pseudoephedrine (ALLEGRA-D ALLERGY & CONGESTION) 180-240 MG 24 hr tablet, Take 1 tablet by mouth daily., Disp: 30 tablet, Rfl: 0 .  pravastatin (PRAVACHOL) 20 MG tablet, Take 20 mg by mouth daily., Disp: , Rfl:  .  simvastatin (ZOCOR) 20 MG tablet, Take 20 mg by mouth every evening., Disp: , Rfl:  .  albuterol (PROVENTIL HFA;VENTOLIN HFA) 108 (90 Base) MCG/ACT inhaler, Inhale 2 puffs into the lungs every 4 (four) hours as needed for wheezing., Disp: 1 Inhaler, Rfl: 0 .  azithromycin (ZITHROMAX Z-PAK) 250 MG tablet, Take 2 tablets (500 mg) on  Day 1,  followed by 1 tablet (250 mg) once daily on Days 2 through 5., Disp: 6 each, Rfl: 0 .  benzonatate (TESSALON PERLES) 100 MG capsule, Take 1 capsule (100 mg total) by mouth 3 (three) times daily as needed for cough., Disp: 15 capsule, Rfl: 0 .  chlorpheniramine-HYDROcodone (TUSSIONEX  PENNKINETIC ER) 10-8 MG/5ML SUER, Take 5 mLs by mouth at bedtime as needed for cough. do not drive or operate machinery while taking as can cause drowsiness., Disp: 75 mL, Rfl: 0 .  predniSONE (DELTASONE) 20 MG tablet, Take 2 tablets (40 mg total) by mouth daily., Disp: 6 tablet, Rfl: 0  Allergies Patient has no known allergies.  Family History  Problem Relation Age of Onset  . Cancer Sister        mouth  "Mother's side-heart  problems"  Social History Social History  Substance Use Topics  . Smoking status: Former Smoker    Packs/day: 2.00    Years: 40.00  . Smokeless tobacco: Never Used  . Alcohol use Yes    Review of Systems Constitutional: As above.  Eyes: No visual changes. ENT: As above.  Cardiovascular: Denies chest pain. Respiratory: Denies shortness of breath. Gastrointestinal: No abdominal pain.  No nausea, no vomiting.  No diarrhea.  No constipation. Genitourinary: Negative for dysuria. Musculoskeletal: Negative for back pain. Skin: Negative for rash. Neurological: Negative for headaches, focal weakness or numbness.  ____________________________________________   PHYSICAL EXAM:  VITAL SIGNS: ED Triage Vitals  Enc Vitals Group     BP 05/29/16 0851 (!) 99/49     Pulse Rate 05/29/16 0851 99     Resp 05/29/16 0851 16     Temp 05/29/16 0851 (!) 102.9 F (39.4 C)     Temp Source 05/29/16 0851 Oral     SpO2 05/29/16 0851 92 %     Weight 05/29/16 0853 164 lb (74.4 kg)     Height 05/29/16 0853 5\' 4"  (1.626 m)     Head Circumference --      Peak Flow --      Pain Score 05/29/16 0850 4     Pain Loc --      Pain Edu? --      Excl. in GC? --    Vitals:   05/29/16 0851 05/29/16 0853 05/29/16 0954 05/29/16 1000  BP: (!) 99/49  (!) 93/53 (!) 124/48  Pulse: 99  91 99  Resp: 16  18 16   Temp: (!) 102.9 F (39.4 C)  100.3 F (37.9 C) 99.2 F (37.3 C)  TempSrc: Oral  Oral Oral  SpO2: 92%  92% 95%  Weight:  164 lb (74.4 kg)    Height:  5\' 4"  (1.626 m)      Constitutional: Alert and oriented. Well appearing and in no acute distress. Eyes: Conjunctivae are normal. PERRL. EOMI. Head: Atraumatic. No sinus tenderness to palpation. No swelling. No erythema.  Ears: no erythema, normal TMs bilaterally.   Nose:Nasal congestion with clear rhinorrhea  Mouth/Throat: Mucous membranes are moist. Mild pharyngeal erythema. No tonsillar swelling or exudate.  Neck: No stridor.  No cervical spine  tenderness to palpation. Hematological/Lymphatic/Immunilogical: No cervical lymphadenopathy. Cardiovascular: Normal rate, regular rhythm. Grossly normal heart sounds.  Good peripheral circulation. Respiratory: Normal respiratory effort.  No retractions. Good air movement. Mild scattered rhonchi. Intermittent cough noted in room with mild bronchospasm wheeze. Speaks in complete sentences.  Gastrointestinal: Soft and nontender. No CVA tenderness. Musculoskeletal: Ambulatory with steady gait. No cervical, thoracic or lumbar tenderness to palpation. Bilateral lower extremities nontender. Bilateral pedal pulses equal and easily palpated.  Neurologic:  Normal speech and language. No gait instability. Skin:  Skin appears warm, dry and intact. No rash noted. Psychiatric: Mood and affect are normal. Speech and behavior are normal.   Well's score for PE: 0.  ___________________________________________   LABS (  all labs ordered are listed, but only abnormal results are displayed)  Labs Reviewed  RAPID INFLUENZA A&B ANTIGENS ([facility] ONLY)  RAPID STREP SCREEN (NOT AT [facility])  CULTURE, GROUP A STREP [facility])    RADIOLOGY  Dg Chest 2 View  Result Date: 05/29/2016 CLINICAL DATA:  Productive cough.  Congestion. EXAM: CHEST  2 VIEW COMPARISON:  None. FINDINGS: No pneumothorax. Mild haziness in the left base could represent atelectasis or early infiltrate. No other acute abnormalities. IMPRESSION: Mild haziness in the left base could represent atelectasis. Early infiltrate not excluded given history. No other acute abnormalities. Electronically Signed   By: Gerome Samavid  Williams III M.D   On: 05/29/2016 09:40   ____________________________________________   PROCEDURES Procedures    INITIAL IMPRESSION / ASSESSMENT AND PLAN / ED COURSE  Pertinent labs & imaging results that were available during my care of the patient were reviewed by me and considered in my medical decision making (see chart for  details).  Well appearing, no acute distress. 1 g oral Tylenol given in urgent care. Influenza and strep negative. Discussed in detail with patient suspect viral infection however as rhonchi present with fever concerning for secondary bacterial infection. Will evaluate chest x-ray. Chest x-ray per radiologist, Mild haziness in the left base could represent atelectasis, early infiltrate cannot be excluded, no other abnormalities. Discussed in detail with patient concern for early infiltrate and early pneumonia. Patient oxygenation saturation 90-93%,duoneb given once in urgent care and patient monitored with improve oxygenation saturation. Patient reports she is feeling much better including cough has improved. Oxygenation improved to 95%. Fever improved with Tylenol. Discussed with patient evaluation of laboratory studies, patient declines at this time. Encouraged PCP follow up in 2 days. Discussed in detail follow-up and return parameters with patient including up to proceeding to emergency room for any chest pain, shortness of breath, syncope or near syncope or worsening concerns. Will start patient on oral azithromycin, prednisone, when necessary albuterol inhaler, when necessary Tessalon Perles and when necessary antacids. Encourage rest, fluids and supportive care.Discussed indication, risks and benefits of medications with patient.  Discussed follow up with Primary care physician this week. Discussed follow up and return parameters including no resolution or any worsening concerns. Patient verbalized understanding and agreed to plan.   ____________________________________________ eat   FINAL CLINICAL IMPRESSION(S) / ED DIAGNOSES  Final diagnoses:  URI with cough and congestion     Discharge Medication List as of 05/29/2016 10:47 AM    START taking these medications   Details  benzonatate (TESSALON PERLES) 100 MG capsule Take 1 capsule (100 mg total) by mouth 3 (three) times daily as needed  for cough., Starting Sat 05/29/2016, Normal    predniSONE (DELTASONE) 20 MG tablet Take 2 tablets (40 mg total) by mouth daily., Starting Sat 05/29/2016, Normal        Note: This dictation was prepared with Dragon dictation along with smaller phrase technology. Any transcriptional errors that result from this process are unintentional.         Renford DillsMiller, Lue Sykora, NP 05/29/16 1305    Renford DillsMiller, Ermine Stebbins, NP 05/29/16 1306

## 2016-05-29 NOTE — ED Triage Notes (Signed)
As per patient feels weak, body ache, cough little yellowish mucus, throat hurts from coughing, itchy ears bur not ear pain, mild HA onset 5 days and lowe grade fever.

## 2016-05-29 NOTE — Discharge Instructions (Signed)
Take medication as prescribed. Rest. Drink plenty of fluids.   Follow up with your primary care physician this week as discussed. Close follow up is important. Return to Urgent care for new or worsening concerns.

## 2016-06-01 LAB — CULTURE, GROUP A STREP (THRC)

## 2016-06-17 ENCOUNTER — Other Ambulatory Visit: Payer: Self-pay | Admitting: Family Medicine

## 2016-06-17 DIAGNOSIS — Z780 Asymptomatic menopausal state: Secondary | ICD-10-CM

## 2016-09-24 ENCOUNTER — Encounter: Payer: Self-pay | Admitting: General Surgery

## 2016-09-29 ENCOUNTER — Encounter: Payer: Self-pay | Admitting: General Surgery

## 2016-09-29 ENCOUNTER — Ambulatory Visit (INDEPENDENT_AMBULATORY_CARE_PROVIDER_SITE_OTHER): Payer: Medicare Other | Admitting: General Surgery

## 2016-09-29 ENCOUNTER — Inpatient Hospital Stay: Payer: Self-pay

## 2016-09-29 DIAGNOSIS — Z1231 Encounter for screening mammogram for malignant neoplasm of breast: Secondary | ICD-10-CM

## 2016-09-29 DIAGNOSIS — E041 Nontoxic single thyroid nodule: Secondary | ICD-10-CM | POA: Diagnosis not present

## 2016-09-29 DIAGNOSIS — Z9889 Other specified postprocedural states: Secondary | ICD-10-CM

## 2016-09-29 NOTE — Progress Notes (Addendum)
Patient ID: Sonia Huang, female   DOB: 03/16/1945, 71 y.o.   MRN: [id number]  Chief Complaint  Patient presents with  . Follow-up    Mammogram and Thryoid Ultrasound    HPI Sonia RegulusDiane Huang is a 71 y.o. female who presents for a breast evaluation. The most recent mammogram was done on 09/23/2016.  Patient does perform regular self breast checks and gets regular mammograms done. Patient also here today for a follow up thyroid ultrasound, following for a right sided thyroid nodule. Denies changes to her breast or thyroid. She is not taking Synthroid.   HPI  Past Medical History:  Diagnosis Date  . Carpal tunnel syndrome 2006  . Hx of abscess of breast 2010  . Hx of cystitis   . Lump or mass in breast 2010   LEFT   . Personal history of tobacco use, presenting hazards to health   . Screening for obesity   . Special screening for malignant neoplasms, colon   . 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 CARPAL TUNNEL RELEASE Right 2006  . CHOLECYSTECTOMY  2000  . COLONOSCOPY  2011  . DILATION AND CURETTAGE OF UTERUS  1983  . INCISION AND DRAINAGE BREAST ABSCESS Left 2010  . TUBAL LIGATION  2010    Family History  Problem Relation Age of Onset  . Cancer Sister        mouth    Social History Social History  Substance Use Topics  . Smoking status: Former Smoker    Packs/day: 2.00    Years: 40.00  . Smokeless tobacco: Never Used  . Alcohol use Yes    No Known Allergies  Current Outpatient Prescriptions  Medication Sig Dispense Refill  . aspirin 81 MG tablet Take 81 mg by mouth daily.    . Calcium Carbonate-Vitamin D (CALCIUM + D PO) Take 1,200 mg by mouth daily.     Marland Kitchen. escitalopram (LEXAPRO) 20 MG tablet Take 20 mg by mouth daily.    . pravastatin (PRAVACHOL) 20 MG tablet Take 40 mg by mouth daily.      No current facility-administered medications for this visit.     Review of Systems Review of Systems   Constitutional: Negative.   Respiratory: Negative.   Cardiovascular: Negative.     Blood pressure 128/63, pulse 72, resp. rate 12, height 5\' 4"  (1.626 m), weight 167 lb (75.8 kg).  Physical Exam Physical Exam  Constitutional: She is oriented to person, place, and time. She appears well-developed and well-nourished.  Eyes: Conjunctivae are normal. No scleral icterus.  Neck: Neck supple. No thyromegaly present.  Cardiovascular: Normal rate, regular rhythm and normal heart sounds.   Pulmonary/Chest: Effort normal and breath sounds normal. Right breast exhibits no inverted nipple, no mass, no nipple discharge, no skin change and no tenderness. Left breast exhibits no inverted nipple, no mass, no nipple discharge, no skin change and no tenderness.  Lymphadenopathy:    She has no cervical adenopathy.    She has no axillary adenopathy.  Neurological: She is alert and oriented to person, place, and time.  Skin: Skin is warm and dry.    Data Reviewed Mammogram and Previous note/ultrasound and lab values   Assessment    Thyroid nodules - US in clinic today shows an increase in size to 2.26 cm of right lower thyroid nodule from 1.4 cm in the last couple of years. FNA of this nodule was done today  in clinic and sent for pathology. A 5 mm nodule was also visualized by [place] on left thyroid that will need to be monitored in the future. TSH drawn on 06/11/2016 was 1.91.    History of breast mass - mammogram is benign.   Plan    With consent FNA/Affirma of right thyroid nodule completed today  With US guidance, 1ml 1 % xylocaine instilled. 22 g needle was used and 2 separate passes made.  Specimen processed and sent for testing.  Procedure well tolerated.   Will communicate thyroid results to her when they return. She can continue to follow up in clinic annually for thyroid monitoring with ultrasound.  Return to PCP for routine mammograms.       HPI, Physical Exam, Assessment and Plan have  been scribed under the direction and in the presence of Kathreen Cosier, MD  Milas Kocher, CMA  I have completed the exam and reviewed the above documentation for accuracy and completeness.  I agree with the above.  Museum/gallery conservator has been used and any errors in dictation or transcription are unintentional.  Seeplaputhur G. Evette Cristal, M.D., F.A.C.S.  Gerlene Burdock G 09/29/2016, 10:02 AM

## 2016-09-29 NOTE — Patient Instructions (Signed)
Return to PCP for mammograms. Will communicate thyroid results to you when they return.

## 2017-12-02 IMAGING — CR DG CHEST 2V
2 series · 2 of 2 positions shown · non-contrast
Comparison: None.

CLINICAL DATA: Productive cough.  Congestion.

EXAM:
CHEST  2 VIEW

[chest pa]
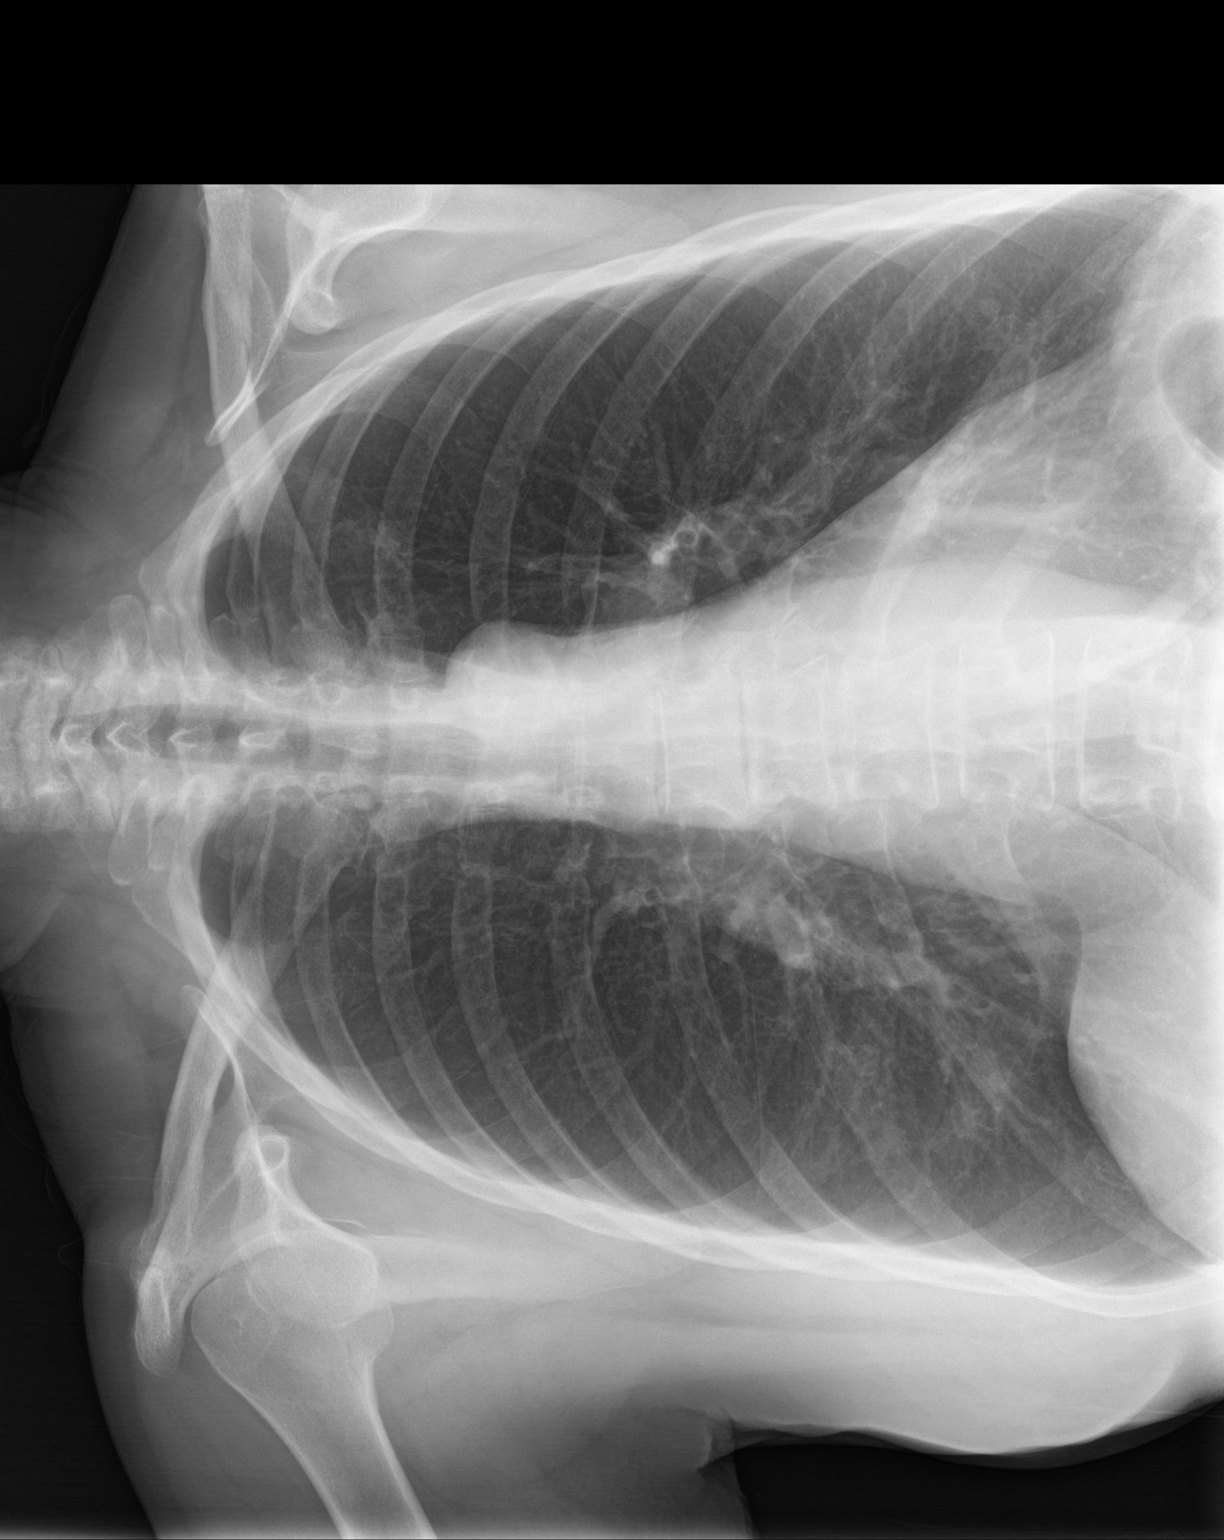

[chest lat]
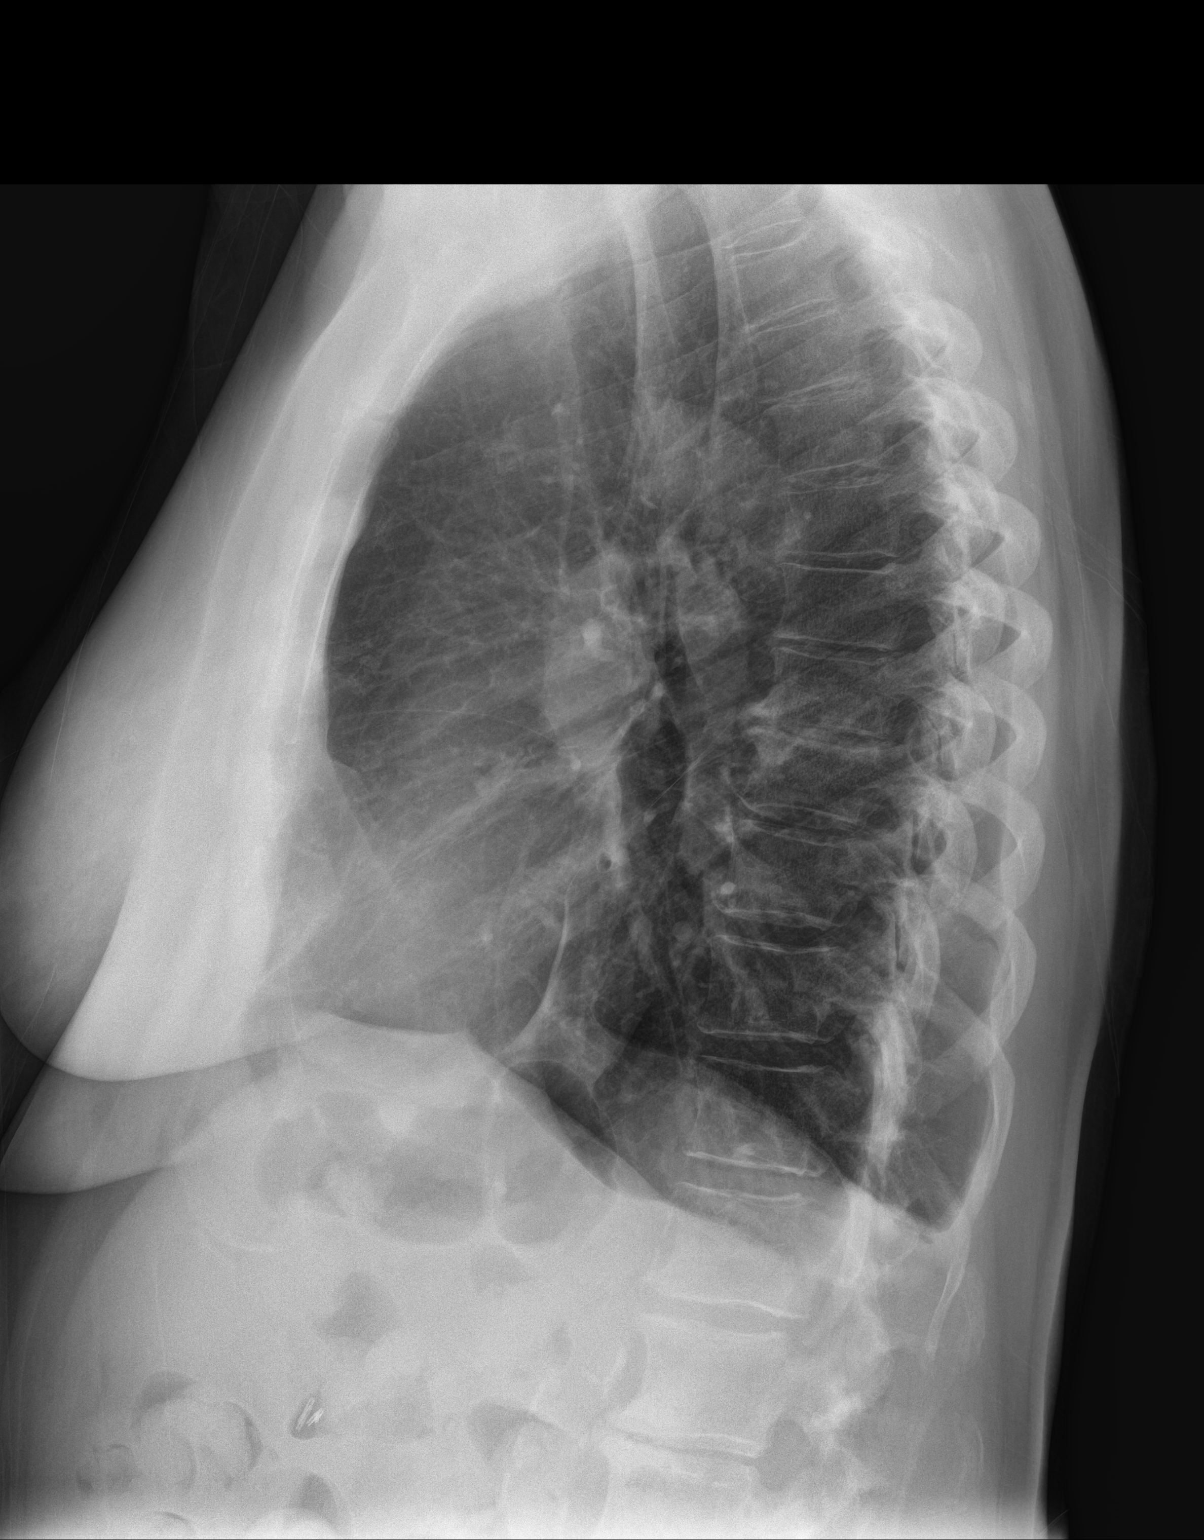

[2 of 2 positions shown; findings below may reference images not displayed]

FINDINGS: No pneumothorax. Mild haziness in the left base could represent
atelectasis or early infiltrate. No other acute abnormalities.
IMPRESSION: Mild haziness in the left base could represent atelectasis. Early
infiltrate not excluded given history. No other acute abnormalities.

## 2017-12-29 DIAGNOSIS — M26609 Unspecified temporomandibular joint disorder, unspecified side: Secondary | ICD-10-CM | POA: Insufficient documentation

## 2017-12-29 DIAGNOSIS — F419 Anxiety disorder, unspecified: Secondary | ICD-10-CM | POA: Insufficient documentation

## 2018-10-03 ENCOUNTER — Encounter: Payer: Self-pay | Admitting: General Surgery

## 2018-10-03 ENCOUNTER — Ambulatory Visit: Payer: Self-pay

## 2018-10-03 ENCOUNTER — Other Ambulatory Visit: Payer: Self-pay

## 2018-10-03 ENCOUNTER — Ambulatory Visit: Payer: Medicare Other | Admitting: General Surgery

## 2018-10-03 DIAGNOSIS — E041 Nontoxic single thyroid nodule: Secondary | ICD-10-CM

## 2018-10-03 NOTE — Patient Instructions (Addendum)
WILL CALL WITH RESULTS.   You may use tylenol or ibuprofen. Use the ice pack several times today.

## 2018-10-04 NOTE — Progress Notes (Signed)
Patient ID: Sonia Huang, female   DOB: 12/27/1945, 73 y.o.   MRN: [id number]  Chief Complaint  Patient presents with  . Other    HPI Sonia Huang is a 74 y.o. female.   She was previously seen in our clinic by Dr. Jamal Huang, most recently in 2018.  She reports having had a thyroid nodule discovered on a LifeScan evaluation.  A dedicated thyroid ultrasound was performed by Dr. Jamal Huang.  A fine-needle aspiration biopsy was performed with reportedly benign results, however I do not seem to have access to these.  Subsequently, a repeat ultrasound demonstrated growth in the size of the right-sided thyroid nodule.  She saw Dr. Jamal Huang again who performed a fine-needle aspiration biopsy.  Unfortunately, the biopsy was nondiagnostic.  For unclear reasons, this was not repeated to obtain a diagnostic specimen.  Sonia Huang states that she was told by Dr. Jamal Huang to just get another ultrasound with her primary care doctor.  She is here today for that evaluation and follow-up.  Today, Sonia Huang states that had she not have the nodule discovered on the LifeScan evaluation, she would not have been aware of its presence.  She denies any pain in her neck, no difficulty swallowing, no changes in her voice.  She does endorse frequent throat clearing, but states that she has allergies.  She denies any heart palpitations or hand tremors.  There have been no changes to the texture of her hair, skin, or fingernails.  She does endorse deliberate weight loss.  No heat or cold intolerance.  No diarrhea or constipation.  She denies any occupational or therapeutic exposure to ionizing radiation.  No family history of thyroid cancer.  Thyroid function testing has been normal and she is not on any thyroid hormone replacement.   Past Medical History:  Diagnosis Date  . Carpal tunnel syndrome 2006  . Hx of abscess of breast 2010  . Hx of cystitis   . Lump or mass in breast 2010   LEFT   . Personal history of tobacco use, presenting  hazards to health   . Screening for obesity   . Special screening for malignant neoplasms, colon   . 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 CARPAL TUNNEL RELEASE Right 2006  . CHOLECYSTECTOMY  2000  . COLONOSCOPY  2011  . DILATION AND CURETTAGE OF UTERUS  1983  . INCISION AND DRAINAGE BREAST ABSCESS Left 2010  . TUBAL LIGATION  2010    Family History  Problem Relation Age of Onset  . Cancer Sister        mouth    Social History Social History   Tobacco Use  . Smoking status: Former Smoker    Packs/day: 2.00    Years: 40.00    Pack years: 80.00  . Smokeless tobacco: Never Used  Substance Use Topics  . Alcohol use: Yes  . Drug use: No    No Known Allergies  Current Outpatient Medications  Medication Sig Dispense Refill  . aspirin 81 MG tablet Take 81 mg by mouth daily.    . Calcium Carbonate-Vitamin D (CALCIUM + D PO) Take 1,200 mg by mouth daily.     Marland Kitchen escitalopram (LEXAPRO) 20 MG tablet Take 20 mg by mouth daily.    . pravastatin (PRAVACHOL) 20 MG tablet Take 40 mg by mouth daily.      No current facility-administered medications for this visit.  Review of Systems Review of Systems  All other systems reviewed and are negative. Are as discussed in the history of present illness  Blood pressure (!) 145/74, pulse 65, temperature 97.8 F (36.6 C), temperature source Skin, height '5\' 4"'$  (1.626 m), weight 159 lb (72.1 kg), SpO2 98 %.  Physical Exam Physical Exam Constitutional:      General: She is not in acute distress.    Appearance: Normal appearance.  HENT:     Head: Normocephalic and atraumatic.     Nose:     Comments: Covered with a mask secondary to COVID-19 precautions    Mouth/Throat:     Comments: Covered with a mask secondary to COVID-19 precautions Eyes:     General: No scleral icterus.       Right eye: No discharge.        Left eye: No discharge.     Conjunctiva/sclera:  Conjunctivae normal.     Comments: No proptosis or exophthalmos  Neck:     Musculoskeletal: Normal range of motion. No neck rigidity.     Comments: No thyromegaly.  No dominant thyroid masses appreciated on palpation.  The thyroid gland moves freely with deglutition. Cardiovascular:     Rate and Rhythm: Normal rate and regular rhythm.     Pulses: Normal pulses.  Pulmonary:     Effort: Pulmonary effort is normal.     Breath sounds: Normal breath sounds.  Abdominal:     General: Abdomen is flat. Bowel sounds are normal.     Palpations: Abdomen is soft.  Genitourinary:    Comments: Deferred Musculoskeletal: Normal range of motion.     Right lower leg: No edema.     Left lower leg: No edema.  Lymphadenopathy:     Cervical: No cervical adenopathy.  Skin:    General: Skin is warm and dry.     Comments: No pretibial dermopathy  Neurological:     General: No focal deficit present.     Mental Status: She is alert and oriented to person, place, and time.  Psychiatric:        Mood and Affect: Mood normal.        Behavior: Behavior normal.     Data Reviewed I reviewed Dr. Angie Fava ultrasound report from 2018; the actual images are not available for review.  The images are also not available from his prior studies in 2016 and 2017 The report from 2018 is copied here:  "Ultrasound thyroid was performed. Previously noted the nodule in the right lobe appears to be larger. The mass is rounded smooth and homogeneous. Nodule measures 2.26 x 1.39 x 1.05 cm. To 3 years ago this measured approximately 1.4-1.5 cm. In the left lobe a new 0.49 mm   nodule is identified near the superior pole.  Given the enlargement of the nodule in the right lobe and repeat FNA with Affirma testing was completed today with patient consent. Ultrasound guidance was used and a 1 mL of 1% Xylocaine instilled in the lower neck after alcohol prep. 22-gauge needle was used in 2 separate passes were made. Aspirates were  processed and sent for cytologic evaluation. Procedure was well-tolerated with no immediate problems encountered."  I also reviewed his notes from multiple prior clinic visits, which discuss stability of the thyroid nodule, up until the September 2018 visit.  The biopsy report from that day is also reviewed with the nondiagnostic findings.  Via the electronic medical record, I was able to review Ms. Gabay's thyroid function  testing.  Her TSH has been within normal range.  Assessment This is a 73 year old woman with an asymptomatic thyroid nodule.  At her last visit in our clinic, the nodule on the right was biopsied, due to growth.  The biopsy was nondiagnostic, but was not repeated.  Plan I performed an ultrasound in clinic today that demonstrated a roughly 2.5 cm thyroid nodule that is solid, hypoechoic, taller than wide, and with increased intranodular blood flow.  It met criteria for biopsy.  This was performed in clinic today.  Please see separate documentation for the details of the imaging and biopsy procedure.  I will contact Ms. Rinks with the results of her biopsy.  If it is benign, she does not require further follow-up, as the nodule has been stable for at least 2 years.  If it is malignant, we will discuss surgical intervention.  If it is indeterminate, I have collected a sample for molecular diagnostic testing and we will determine the best course of action based upon those results.  Follow-up will be determined based upon these findings.    Fredirick Maudlin 10/04/2018, 1:02 PM

## 2018-10-13 ENCOUNTER — Encounter: Payer: Self-pay | Admitting: General Surgery

## 2018-12-26 ENCOUNTER — Ambulatory Visit: Payer: Self-pay

## 2018-12-26 ENCOUNTER — Encounter: Payer: Self-pay | Admitting: General Surgery

## 2018-12-26 ENCOUNTER — Other Ambulatory Visit: Payer: Self-pay

## 2018-12-26 ENCOUNTER — Ambulatory Visit: Payer: Medicare Other | Admitting: General Surgery

## 2018-12-26 VITALS — BP 154/66 | HR 73 | Temp 97.9°F | Ht 64.0 in | Wt 166.2 lb

## 2018-12-26 DIAGNOSIS — E041 Nontoxic single thyroid nodule: Secondary | ICD-10-CM

## 2018-12-26 NOTE — Patient Instructions (Addendum)
We will have you follow up with [place] in one year with a Thyroid Ultrasound in clinic. We will send you a letter when it is time.   Continue to use the ice pack through out the day, this will help with any discomfort and prevent bleeding. You may use Tylenol or Ibuprofen as needed for pain relief.  Call us if you develop a fever, signs of infection(spreading redness or warmth), or excess bleeding from the site.   We will call you with your results.    Thyroid Needle Biopsy, Care After This sheet gives you information about how to care for yourself after your procedure. Your health care provider may also give you more specific instructions. If you have problems or questions, contact your health care provider. What can I expect after the procedure? After the procedure, it is common to have:  Soreness and tenderness that lasts for a few days.  Bruising where the needle was inserted (puncture site). Follow these instructions at home:   Take over-the-counter and prescription medicines only as told by your health care provider.  To help ease discomfort, keep your head raised (elevated) when you are lying down. When you move from lying down to sitting up, use both hands to support the back of your head and neck.  Check your puncture site every day for signs of infection. Check for: ? Redness, swelling, or pain. ? Fluid or blood. ? Warmth. ? Pus or a bad smell.  Return to your normal activities as told by your health care provider. Ask your health care provider what activities are safe for you.  Keep all follow-up visits as told by your health care provider. This is important. Contact a health care provider if:  You have redness, swelling, or pain around your puncture site.  You have fluid or blood coming from your puncture site.  Your puncture site feels warm to the touch.  You have pus or a bad smell coming from your puncture site.  You have a fever. Get help right away if:  You  have severe bleeding from the puncture site.  You have difficulty swallowing.  You have swollen glands (lymph nodes) in your neck. Summary  It is common to have some bruising and soreness where the needle was inserted in your lower front neck area (puncture site).  Check your puncture site every day for signs of infection, such as redness, swelling, or pain.  Get help right away if you have severe bleeding from your puncture site. This information is not intended to replace advice given to you by your health care provider. Make sure you discuss any questions you have with your health care provider. Document Released: 08/01/2013 Document Revised: 12/17/2016 Document Reviewed: 10/18/2016 Elsevier Patient Education  2020 [employer].

## 2018-12-26 NOTE — Progress Notes (Signed)
Ms. Stenberg is here for a repeat FNA of her right thyroid nodule.  Prior FNAB was non diagnostic.  Procedure Note: Ultrasound-Guided Fine Needle Aspiration Biopsy  Indications:  thyroid nodule (right)  Anesthetic: ethyl chloride spray  Procedure: Under direct sonographic guidance, the target lesion was isolated and sampled using a 23-gauge needle.  A total of 6 passes were made.  The air-dried Diff-Quick stained slides were viewed on-site by the performing surgeon.  ThyroSeq [facility]) was collected. Pressure was held and an ice pack applied. Specimen(s) sent to pathology for evaluation. The patient tolerated the procedure well.   Findings: few thyroid cells seen, anticipate non-diagnostic  Complications: none  Fredirick Maudlin   Due to multiple non-diagnostic biopsies and low index of suspicion for malignancy, will not pursue diagnostic lobectomy at this time. She will return to clinic in 1 year for a surveillance ultrasound. Her PCP can continue to monitor her thyroid function tests, as indicated.

## 2019-03-13 ENCOUNTER — Other Ambulatory Visit: Payer: Self-pay | Admitting: Family Medicine

## 2019-03-13 DIAGNOSIS — I7090 Unspecified atherosclerosis: Secondary | ICD-10-CM

## 2019-05-04 ENCOUNTER — Other Ambulatory Visit: Payer: Self-pay

## 2019-05-04 ENCOUNTER — Ambulatory Visit
Admission: RE | Admit: 2019-05-04 | Discharge: 2019-05-04 | Disposition: A | Discharge status: Home / Self Care | Payer: Medicare Other | Source: Ambulatory Visit | Attending: Family Medicine | Admitting: Family Medicine

## 2019-05-04 DIAGNOSIS — I7090 Unspecified atherosclerosis: Secondary | ICD-10-CM | POA: Diagnosis not present

## 2019-08-30 ENCOUNTER — Encounter: Payer: Self-pay | Admitting: Emergency Medicine

## 2019-08-30 ENCOUNTER — Emergency Department
Admission: EM | Admit: 2019-08-30 | Discharge: 2019-08-30 | Disposition: A | Discharge status: Home / Self Care | Payer: Medicare Other | Attending: Student in an Organized Health Care Education/Training Program | Admitting: Student in an Organized Health Care Education/Training Program

## 2019-08-30 ENCOUNTER — Ambulatory Visit
Admission: EM | Admit: 2019-08-30 | Discharge: 2019-08-30 | Disposition: A | Discharge status: Other Acute Inpatient Hospital | Payer: Medicare Other | Attending: Family Medicine | Admitting: Family Medicine

## 2019-08-30 ENCOUNTER — Other Ambulatory Visit: Payer: Self-pay

## 2019-08-30 DIAGNOSIS — Z87891 Personal history of nicotine dependence: Secondary | ICD-10-CM | POA: Insufficient documentation

## 2019-08-30 DIAGNOSIS — Y939 Activity, unspecified: Secondary | ICD-10-CM | POA: Diagnosis not present

## 2019-08-30 DIAGNOSIS — Y999 Unspecified external cause status: Secondary | ICD-10-CM | POA: Diagnosis not present

## 2019-08-30 DIAGNOSIS — T782XXA Anaphylactic shock, unspecified, initial encounter: Secondary | ICD-10-CM | POA: Diagnosis not present

## 2019-08-30 DIAGNOSIS — Y929 Unspecified place or not applicable: Secondary | ICD-10-CM | POA: Diagnosis not present

## 2019-08-30 DIAGNOSIS — X58XXXA Exposure to other specified factors, initial encounter: Secondary | ICD-10-CM | POA: Insufficient documentation

## 2019-08-30 MED ORDER — SODIUM CHLORIDE 0.9 % IV BOLUS
1000.0000 mL | Freq: Once | INTRAVENOUS | Status: AC
  Administered 2019-08-30: 1000 mL via INTRAVENOUS

## 2019-08-30 MED ORDER — DIPHENHYDRAMINE HCL 50 MG/ML IJ SOLN
50.0000 mg | Freq: Once | INTRAMUSCULAR | Status: AC
  Administered 2019-08-30: 50 mg via INTRAVENOUS

## 2019-08-30 MED ORDER — ALBUTEROL SULFATE (2.5 MG/3ML) 0.083% IN NEBU
5.0000 mg | INHALATION_SOLUTION | Freq: Once | RESPIRATORY_TRACT | Status: AC
  Administered 2019-08-30: 5 mg via RESPIRATORY_TRACT

## 2019-08-30 MED ORDER — EPINEPHRINE 0.3 MG/0.3ML IJ SOAJ: 0.3000 mg | INTRAMUSCULAR | 1 refills | Status: AC | PRN

## 2019-08-30 MED ORDER — PREDNISONE 20 MG PO TABS: 40.0000 mg | ORAL_TABLET | Freq: Every day | ORAL | 0 refills | Status: AC

## 2019-08-30 MED ORDER — FAMOTIDINE IN NACL 20-0.9 MG/50ML-% IV SOLN
20.0000 mg | Freq: Once | INTRAVENOUS | Status: AC
Start: 2019-08-30 — End: 2019-08-30
  Administered 2019-08-30: 20 mg via INTRAVENOUS
  Filled 2019-08-30: qty 50

## 2019-08-30 MED ORDER — EPINEPHRINE PF 1 MG/ML IJ SOLN
0.3000 mg | Freq: Once | INTRAMUSCULAR | Status: AC
  Administered 2019-08-30: 0.3 mg via INTRAMUSCULAR

## 2019-08-30 MED ORDER — ONDANSETRON HCL 4 MG/2ML IJ SOLN
4.0000 mg | Freq: Once | INTRAMUSCULAR | Status: AC
  Administered 2019-08-30: 4 mg via INTRAVENOUS

## 2019-08-30 MED ORDER — METHYLPREDNISOLONE SODIUM SUCC 125 MG IJ SOLR
125.0000 mg | Freq: Once | INTRAMUSCULAR | Status: AC
  Administered 2019-08-30: 125 mg via INTRAVENOUS

## 2019-08-30 NOTE — ED Provider Notes (Signed)
[facility] Emergency Department Provider Note    First MD Initiated Contact with Patient 08/30/19 1158     (approximate)  I have reviewed the triage vital signs and the nursing notes.   HISTORY  Chief Complaint Allergic Reaction    HPI Trenda Pippins is a 74 y.o. female with the below listed past medical presents to the ER for evaluation management of anaphylactic reaction that occurred after a insect bite to her left thumb occurred around 11.  Went emergently to urgent care was having diffuse urticaria with shortness of breath lightheadedness.  She was given epi, famotidine, Benadryl and steroid.  She feels much improved right now.  No history of anaphylactic reaction.    Past Medical History:  Diagnosis Date  . Carpal tunnel syndrome 2006  . Hx of abscess of breast 2010  . Hx of cystitis   . Lump or mass in breast 2010   LEFT   . Personal history of tobacco use, presenting hazards to health   . Screening for obesity   . Special screening for malignant neoplasms, colon   . Thyroid disease 2012   nontoxic uninodular goiter   Family History  Problem Relation Age of Onset  . Cancer Sister        mouth   Past Surgical History:  Procedure Laterality Date  . CARPAL TUNNEL RELEASE Right 2006  . CHOLECYSTECTOMY  2000  . COLONOSCOPY  2011  . DILATION AND CURETTAGE OF UTERUS  1983  . INCISION AND DRAINAGE BREAST ABSCESS Left 2010  . TUBAL LIGATION  2010   Patient Active Problem List   Diagnosis Date Noted  . Anxiety 12/29/2017  . TMJ (temporomandibular joint syndrome) 12/29/2017  . Rectocele 11/15/2013  . 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 Eczema 11/15/2013  . Menopause 11/15/2013  . Neuralgia 11/15/2013  . Nocturia 11/15/2013  . Palpitations 11/15/2013  . Tingling 11/15/2013  . Vaginal wall prolapse 11/15/2013  . Alcohol ingestion 05/01/2013  . Breast cyst 05/01/2013  . Thyroid nodule 04/27/2012  . Multiple thyroid nodules  04/25/2012  . Mixed hyperlipidemia 01/14/2011      Prior to Admission medications   Medication Sig Start Date End Date Taking? Authorizing Provider  aspirin 81 MG tablet Take 81 mg by mouth daily.    [provider]  Calcium Carbonate-Vitamin D (CALCIUM + D PO) Take 1,200 mg by mouth daily.     [provider]  EPINEPHrine 0.3 mg/0.3 mL IJ SOAJ injection Inject 0.3 mLs (0.3 mg total) into the muscle as needed for anaphylaxis. 08/30/19   Willy Eddy, MD  escitalopram (LEXAPRO) 20 MG tablet Take 20 mg by mouth daily.    [provider]  pravastatin (PRAVACHOL) 20 MG tablet Take 40 mg by mouth daily.     [provider]  predniSONE (DELTASONE) 20 MG tablet Take 2 tablets (40 mg total) by mouth daily for 5 days. 08/30/19 09/04/19  Willy Eddy, MD    Allergies Patient has no allergy information on record.    Social History Social History   Tobacco Use  . Smoking status: Former Smoker    Packs/day: 2.00    Years: 40.00    Pack years: 80.00  . Smokeless tobacco: Never Used  Substance Use Topics  . Alcohol use: Yes  . Drug use: No    Review of Systems Patient denies headaches, rhinorrhea, blurry vision, numbness, shortness of breath, chest pain, edema, cough, abdominal pain, nausea, vomiting, diarrhea, dysuria, fevers,  rashes or hallucinations unless otherwise stated above in HPI. ____________________________________________   PHYSICAL EXAM:  VITAL SIGNS: Vitals:   08/30/19 1423 08/30/19 1508  BP: 112/64 131/60  Pulse: 77 69  Resp: 18 17  Temp:  98 F (36.7 C)  SpO2: 94% 94%    Constitutional: Alert and oriented.  Eyes: Conjunctivae are normal.  Head: Atraumatic. Nose: No congestion/rhinnorhea. Mouth/Throat: Mucous membranes are moist.  No uvular edema Neck: No stridor. Painless ROM.  Cardiovascular: Normal rate, regular rhythm. Grossly normal heart sounds.  Good peripheral circulation. Respiratory: Normal respiratory  effort.  No retractions. Lungs CTAB. Gastrointestinal: Soft and nontender. No distention. No abdominal bruits. No CVA tenderness. Genitourinary:  Musculoskeletal: No lower extremity tenderness nor edema.  No joint effusions. Neurologic:  Normal speech and language. No gross focal neurologic deficits are appreciated. No facial droop Skin:  Skin is warm, dry and intact.  Urticarial irruption on all 4 extremities and trunk. Psychiatric: Mood and affect are normal. Speech and behavior are normal.  ____________________________________________   LABS (all labs ordered are listed, but only abnormal results are displayed)  No results found for this or any previous visit (from the past 24 hour(s)). ________________________________________________________________________________________  RADIOLOGY   ____________________________________________   PROCEDURES  Procedure(s) performed:  Procedures    Critical Care performed: no ____________________________________________   INITIAL IMPRESSION / ASSESSMENT AND PLAN / ED COURSE  Pertinent labs & imaging results that were available during my care of the patient were reviewed by me and considered in my medical decision making (see chart for details).   DDX: anaphylaxis, anaphylactoid, urticaria, dermatitis  Demetrice Espinoza is a 74 y.o. who presents to the ED with symptoms as described above presenting with evidence of anaphylactic reaction after some sort of insect bite or sting.  Was given epinephrine and treatment as described above.  She clinically appears better.  Will monitor here in the ER.  Clinical Course as of Aug 29 1528  Thu Aug 30, 2019  1505 Patient feels well.  Urticaria has resolved.  Ros the patient did not receive Pepcid at urgent care therefore will add on.  She is tolerating p.o.  She is been observed for 4 hours after epinephrine injection and she feels well.She is appropriate for discharge home.   [PR]    Clinical Course  User Index [PR] Willy Eddy, MD    The patient was evaluated in Emergency Department today for the symptoms described in the history of present illness. He/she was evaluated in the context of the global COVID-19 pandemic, which necessitated consideration that the patient might be at risk for infection with the SARS-CoV-2 virus that causes COVID-19. Institutional protocols and algorithms that pertain to the evaluation of patients at risk for COVID-19 are in a state of rapid change based on information released by regulatory bodies including the CDC and federal and state organizations. These policies and algorithms were followed during the patient's care in the ED.  As part of my medical decision making, I reviewed the following data within the electronic MEDICAL RECORD NUMBER Nursing notes reviewed and incorporated, Labs reviewed, notes from prior ED visits and [place] Controlled Substance Database   ____________________________________________   FINAL CLINICAL IMPRESSION(S) / ED DIAGNOSES  Final diagnoses:  Anaphylaxis, initial encounter      NEW MEDICATIONS STARTED DURING THIS VISIT:  Discharge Medication List as of 08/30/2019  3:02 PM    START taking these medications   Details  EPINEPHrine 0.3 mg/0.3 mL IJ SOAJ injection Inject 0.3 mLs (0.3  mg total) into the muscle as needed for anaphylaxis., Starting Thu 08/30/2019, Normal    predniSONE (DELTASONE) 20 MG tablet Take 2 tablets (40 mg total) by mouth daily for 5 days., Starting Thu 08/30/2019, Until Tue 09/04/2019, Normal         Note:  This document was prepared using Dragon voice recognition software and may include unintentional dictation errors.    Willy Eddy, MD 08/30/19 1530

## 2019-08-30 NOTE — ED Provider Notes (Signed)
[facility]    CSN: [id number] Arrival date & time: 08/30/19  1051      History   Chief Complaint Chief Complaint  Patient presents with  . Allergic Reaction   HPI  74 year old female presents with acute onset shortness of breath and rash following suspected bee sting.  Patient states that she was mowing grass and believes that she was stung by bee.  Location: Left thumb.  She separately developed shortness of breath and throat tightness.  Diffuse erythematous raised rash.  Patient is acutely short of breath at this time.  Also reports nausea.  No vomiting.  No diarrhea.  No medications interventions tried.  Past Medical History:  Diagnosis Date  . Carpal tunnel syndrome 2006  . Hx of abscess of breast 2010  . Hx of cystitis   . Lump or mass in breast 2010   LEFT   . Personal history of tobacco use, presenting hazards to health   . Screening for obesity   . Special screening for malignant neoplasms, colon   . 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 Anxiety 12/29/2017  . TMJ (temporomandibular joint syndrome) 12/29/2017  . Rectocele 11/15/2013  . 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 Eczema 11/15/2013  . Menopause 11/15/2013  . Neuralgia 11/15/2013  . Nocturia 11/15/2013  . Palpitations 11/15/2013  . Tingling 11/15/2013  . Vaginal wall prolapse 11/15/2013  . Alcohol ingestion 05/01/2013  . Breast cyst 05/01/2013  . Thyroid nodule 04/27/2012  . Multiple thyroid nodules 04/25/2012  . Mixed hyperlipidemia 01/14/2011    Past Surgical History:  Procedure Laterality Date  . CARPAL TUNNEL RELEASE Right 2006  . CHOLECYSTECTOMY  2000  . COLONOSCOPY  2011  . DILATION AND CURETTAGE OF UTERUS  1983  . INCISION AND DRAINAGE BREAST ABSCESS Left 2010  . TUBAL LIGATION  2010    OB History    Gravida  1   Para  1   Term      Preterm      AB      Living  1     SAB       TAB      Ectopic      Multiple      Live Births           Obstetric Comments  FIRST PREGNANCY 22 FIRST MENSTRUAL 14 LMP 1995         Home Medications    Prior to Admission medications   Medication Sig Start Date End Date Taking? Authorizing Provider  aspirin 81 MG tablet Take 81 mg by mouth daily.    [provider]  Calcium Carbonate-Vitamin D (CALCIUM + D PO) Take 1,200 mg by mouth daily.     [provider]  escitalopram (LEXAPRO) 20 MG tablet Take 20 mg by mouth daily.    [provider]  pravastatin (PRAVACHOL) 20 MG tablet Take 40 mg by mouth daily.     [provider]    Family History Family History  Problem Relation Age of Onset  . Cancer Sister        mouth    Social History Social History   Tobacco Use  . Smoking status: Former Smoker    Packs/day: 2.00    Years: 40.00    Pack years: 80.00  . Smokeless tobacco: Never Used  Substance Use Topics  . Alcohol use: Yes  .  Drug use: No     Allergies   Patient has no known allergies.   Review of Systems Review of Systems  HENT:       Throat tightness.  Respiratory: Positive for shortness of breath.   Gastrointestinal: Positive for nausea.  Skin: Positive for rash.   Physical Exam Triage Vital Signs ED Triage Vitals [08/30/19 1055]  Enc Vitals Group     BP 123/64     Pulse Rate 87     Resp      Temp 98.3 F (36.8 C)     Temp Source Oral     SpO2 93 %     Weight      Height      Head Circumference      Peak Flow      Pain Score      Pain Loc      Pain Edu?      Excl. in GC?    Updated Vital Signs BP (!) 166/75 (BP Location: Right Arm)   Pulse 82   Temp 98.3 F (36.8 C) (Oral)   Wt 75.4 kg   SpO2 93%   BMI 28.53 kg/m   Visual Acuity Right Eye Distance:   Left Eye Distance:   Bilateral Distance:    Right Eye Near:   Left Eye Near:    Bilateral Near:     Physical Exam Constitutional:      Comments: Diffuse rash noted.  Mild  increased work of breathing.  HENT:     Head: Normocephalic and atraumatic.     Mouth/Throat:     Comments: Oropharynx appears slightly edematous.  Mild erythema. Eyes:     General:        Right eye: No discharge.        Left eye: No discharge.     Conjunctiva/sclera: Conjunctivae normal.  Cardiovascular:     Rate and Rhythm: Normal rate and regular rhythm.     Heart sounds: No murmur heard.   Pulmonary:     Comments: Mild increased work of breathing.  Lungs clear to auscultation. Abdominal:     General: There is no distension.     Palpations: Abdomen is soft.     Tenderness: There is no abdominal tenderness.  Skin:    Comments: Diffuse raised erythematous rash.   Neurological:     Mental Status: She is alert.    UC Treatments / Results  Labs (all labs ordered are listed, but only abnormal results are displayed) Labs Reviewed - No data to display  EKG   Radiology No results found.  Procedures Procedures (including critical care time)  Medications Ordered in UC Medications  methylPREDNISolone sodium succinate (SOLU-MEDROL) 125 mg/2 mL injection 125 mg (125 mg Intravenous Given 08/30/19 1113)  ondansetron (ZOFRAN) injection 4 mg (4 mg Intravenous Given 08/30/19 1059)  diphenhydrAMINE (BENADRYL) injection 50 mg (50 mg Intravenous Given 08/30/19 1100)  sodium chloride 0.9 % bolus 1,000 mL (1,000 mLs Intravenous New Bag/Given 08/30/19 1055)  albuterol (PROVENTIL) (2.5 MG/3ML) 0.083% nebulizer solution 5 mg (5 mg Nebulization Given 08/30/19 1110)    Initial Impression / Assessment and Plan / UC Course  I have reviewed the triage vital signs and the nursing notes.  Pertinent labs & imaging results that were available during my care of the patient were reviewed by me and considered in my medical decision making (see chart for details).    74 year old female presents with anaphylaxis.  IV placed.  IV fluids going.  Patient given immediate epinephrine via EpiPen.  Required  oxygen briefly.  Subsequently given IV Solu-Medrol 125 mg, diphenhydramine 50 mg, and 1 albuterol treatment (5 mg).  Oxygen saturation currently stable.  Patient is being transported via EMS for further monitoring and care in the emergency room.  Final Clinical Impressions(s) / UC Diagnoses   Final diagnoses:  Anaphylaxis, initial encounter   Discharge Instructions   None    ED Prescriptions    None     PDMP not reviewed this encounter.   Tommie Sams, DO 08/30/19 1120

## 2019-08-30 NOTE — ED Triage Notes (Signed)
Pt presents via [facility] from [facility] urgent care with c/o allergic reaction. Pt was stung on left hand by unknown insect. Pt became short of breath and hives presented on bilateral legs so patient's husband took patient to urgent care. At urgent care patient was given 0.3mg  epinephrine IM, 125 solumedrol, 4mg  zofran, 50 mg benadryl, and albuterol breathing tx. Pt is currently alert and oriented and respirations even and unlabored at this time. VSS.

## 2019-08-30 NOTE — ED Notes (Signed)
Pt able to ambulate to the restroom independently. Pt in NAD and respirations remain even and unlabored at this time. Pt assisted back to bed.

## 2019-08-30 NOTE — ED Triage Notes (Signed)
Patient was stung about 1 hour ago on her left hand by a bee or yellow jacket. Patient is c/o difficulty breathing, throat closing, hives and itching all over her body.

## 2019-08-30 NOTE — ED Notes (Signed)
Patient is being discharged from the Urgent Care and sent to the Emergency Department via EMS . Per Dr. Adriana Simas, patient is in need of higher level of care due to allergic reaction to bee sting. Patient is aware and verbalizes understanding of plan of care.  Vitals:   08/30/19 1111 08/30/19 1118  BP:  (!) 177/71  Pulse:  79  Temp:    SpO2: 93% 96%

## 2019-08-30 NOTE — ED Notes (Signed)
E-signature not working at this time. Pt verbalized understanding of D/C instructions, prescriptions and follow up care with no further questions at this time. Pt in NAD and ambulatory at time of D/C.  

## 2019-08-31 ENCOUNTER — Ambulatory Visit: Payer: Self-pay

## 2019-08-31 NOTE — Telephone Encounter (Signed)
Patient called and says she was prescribed Prednisone for a reaction from a bee sting. She says she started taking the prednisone this morning and now she's itching all over. She says she red the medication information from the pharmacy and itching is a side effect. She asks what should she do. I advised to take Benadryl for the itching and call her PCP's office for further recommendation. She verbalized understanding.  Reason for Disposition . [1] Caller has URGENT medicine question about med that PCP or specialist prescribed AND [2] triager unable to answer question  Answer Assessment - Initial Assessment Questions 1. NAME of MEDICATION: "What medicine are you calling about?"     Prednison 2. QUESTION: "What is your question?" (e.g., medication refill, side effect)     Itching 3. PRESCRIBING HCP: "Who prescribed it?" Reason: if prescribed by specialist, call should be referred to that group.     Prescribed for anaphylaxis 4. SYMPTOMS: "Do you have any symptoms?"     Itching really bad 5. SEVERITY: If symptoms are present, ask "Are they mild, moderate or severe?"     Severe 6. PREGNANCY:  "Is there any chance that you are pregnant?" "When was your last menstrual period?"     No  Protocols used: MEDICATION QUESTION CALL-A-AH

## 2019-09-21 ENCOUNTER — Other Ambulatory Visit: Payer: Self-pay | Admitting: Family Medicine

## 2019-09-21 DIAGNOSIS — E041 Nontoxic single thyroid nodule: Secondary | ICD-10-CM

## 2019-10-02 ENCOUNTER — Ambulatory Visit
Admission: RE | Admit: 2019-10-02 | Discharge: 2019-10-02 | Disposition: A | Discharge status: Home / Self Care | Payer: Medicare Other | Source: Ambulatory Visit | Attending: Family Medicine | Admitting: Family Medicine

## 2019-10-02 ENCOUNTER — Other Ambulatory Visit: Payer: Self-pay

## 2019-10-02 DIAGNOSIS — E041 Nontoxic single thyroid nodule: Secondary | ICD-10-CM | POA: Insufficient documentation

## 2019-12-06 ENCOUNTER — Ambulatory Visit: Payer: Medicare Other | Admitting: General Surgery

## 2020-11-06 ENCOUNTER — Encounter: Payer: Self-pay | Admitting: General Surgery

## 2021-04-06 IMAGING — US US THYROID
1 series · 13 of 25 positions shown · non-contrast
Comparison: 09/29/2016

CLINICAL DATA: Prior ultrasound follow-up. 73-year-old female with
history of thyroid nodule, previously biopsied on 09/29/2016.

EXAM:
THYROID ULTRASOUND
TECHNIQUE: Ultrasound examination of the thyroid gland and adjacent soft
tissues was performed.

[Series 1: us thyroid · 0.07mm/px · 13 of 38 slices shown]
[im 1/38]
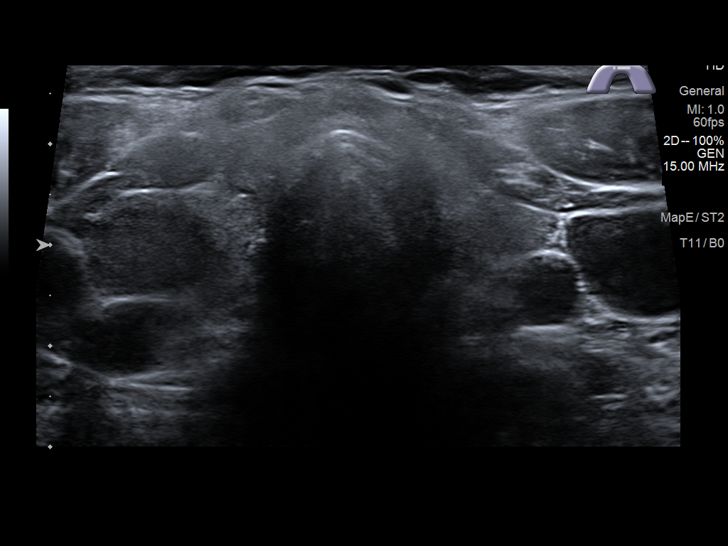
[im 4/38]
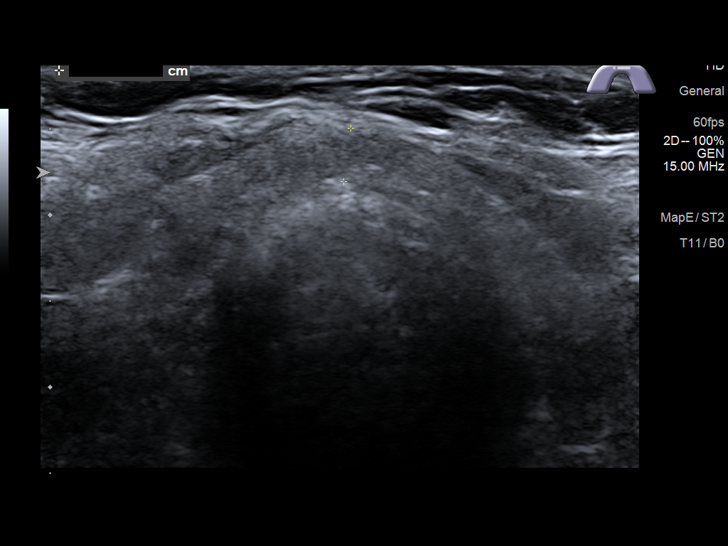
[im 7/38]
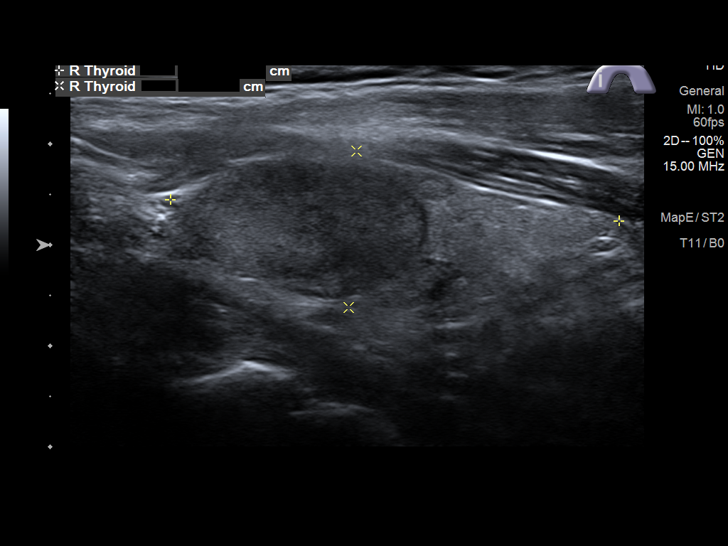
[im 10/38]
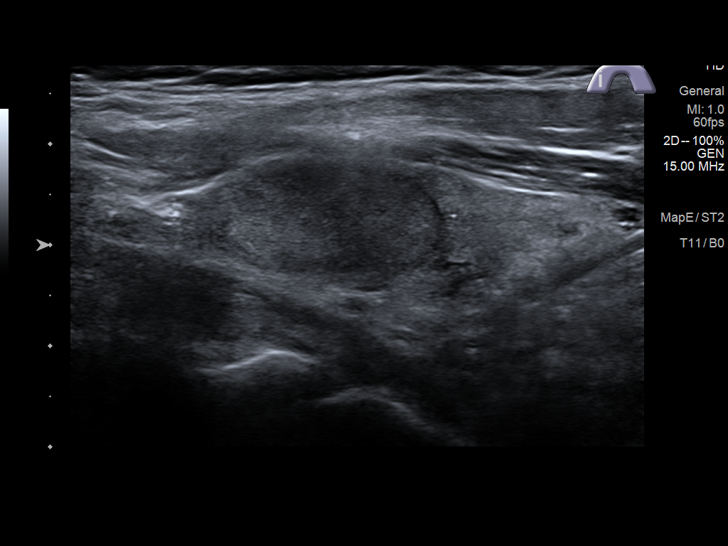
[im 13/38]
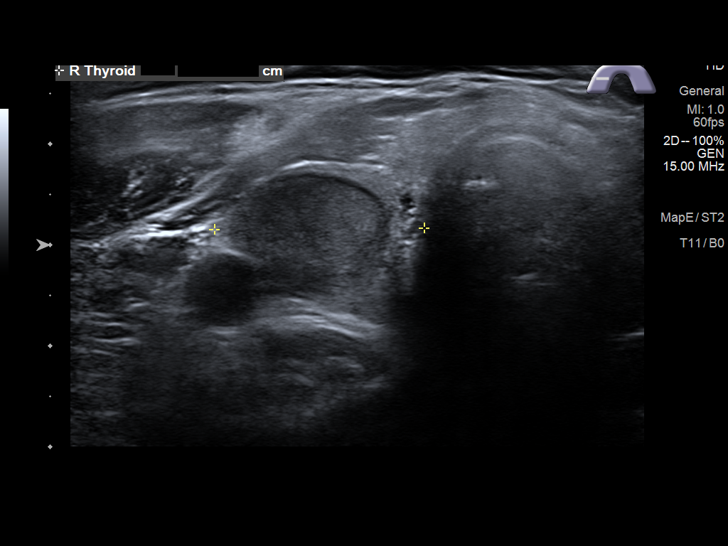
[im 16/38]
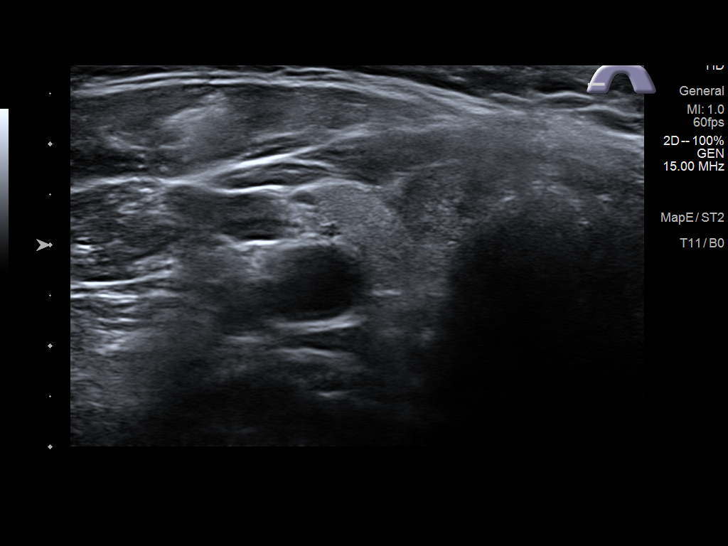
[im 19/38]
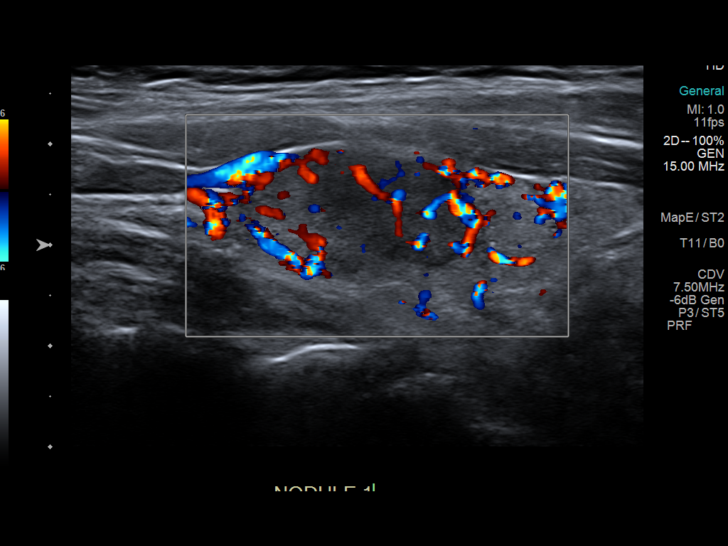
[im 22/38]
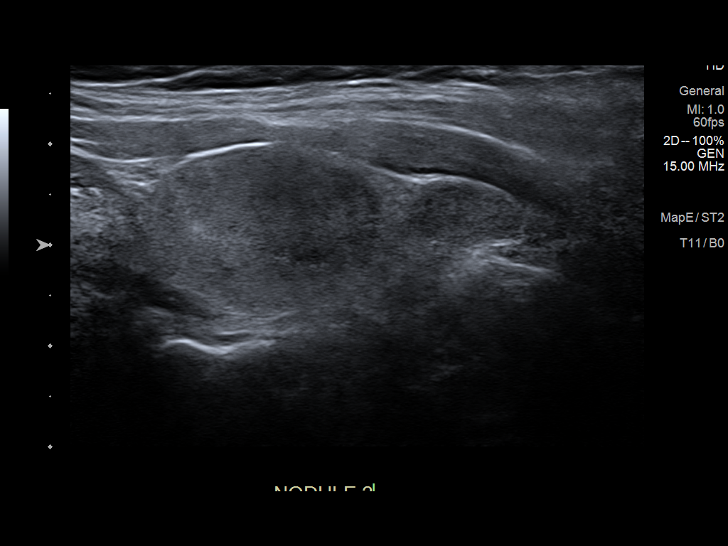
[im 25/38]
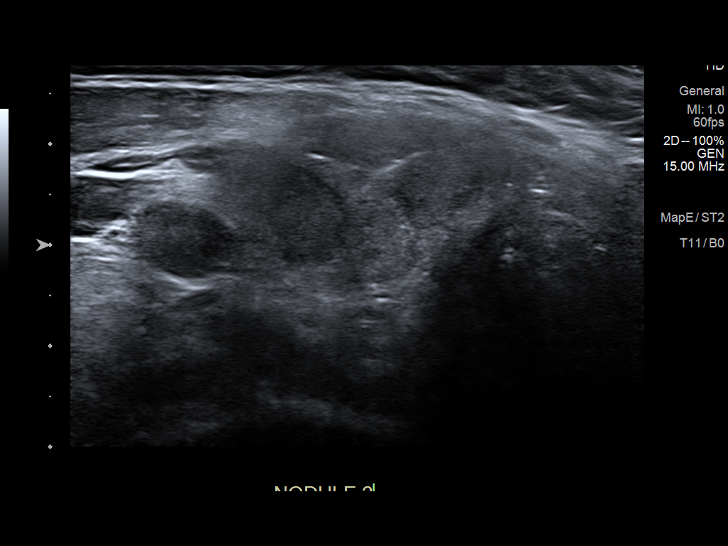
[im 28/38]
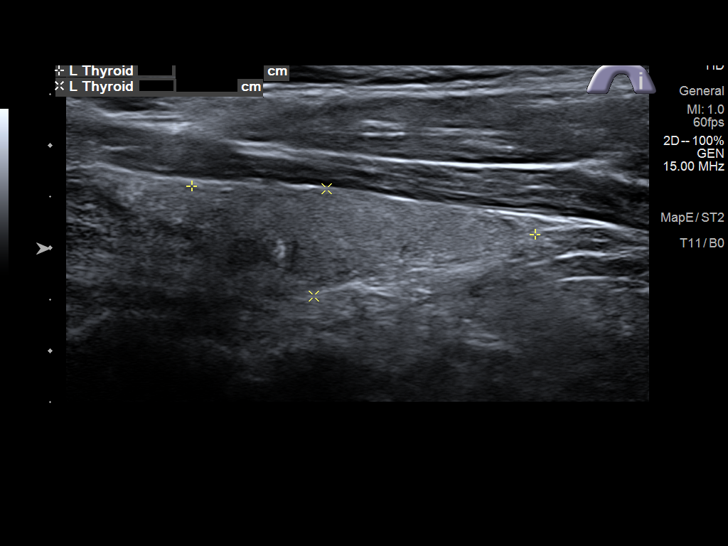
[im 31/38]
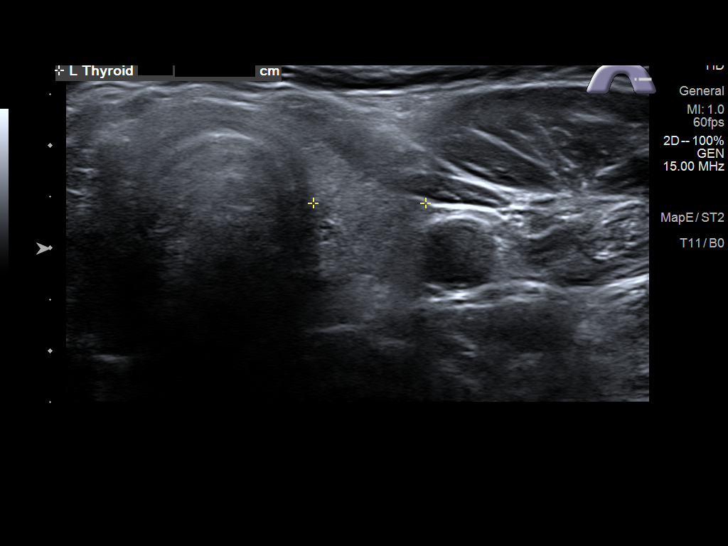
[im 34/38]
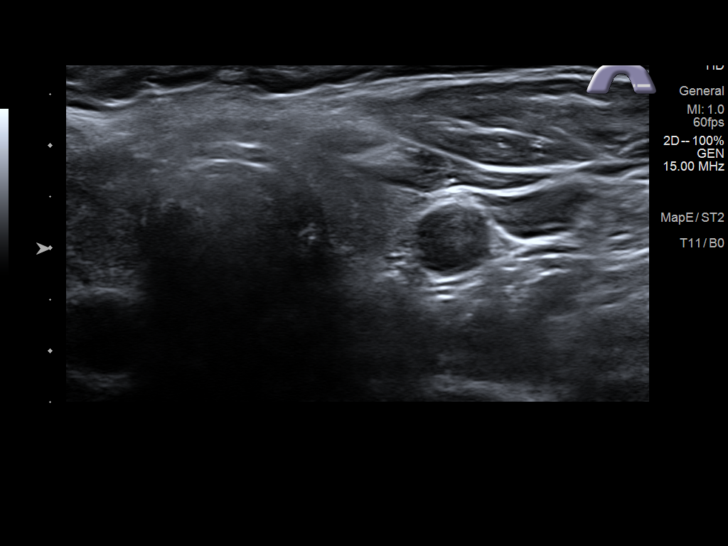
[im 38/38]
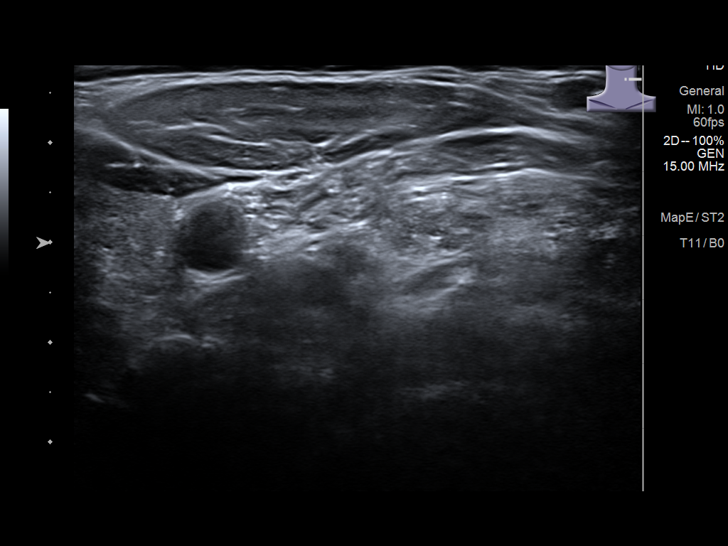

[13 of 25 positions shown; findings below may reference images not displayed]

FINDINGS: Parenchymal Echotexture: Mildly heterogenous

Isthmus: 0.3 cm

Right lobe: 4.4 x 1.6 x 2.1 cm

Left lobe: 3.4 x 1.1 x 1.1 cm

_________________________________________________________

Estimated total number of nodules >/= 1 cm: 2

Number of spongiform nodules >/=  2 cm not described below (TR1): 0

Number of mixed cystic and solid nodules >/= 1.5 cm not described
below (TR2): 0

_________________________________________________________

Nodule # 1:

Prior biopsy: Yes

Location: Right; Superior

Maximum size: 2.4 cm; Other 2 dimensions: 1.7 x 1.3 cm, previously,
2.3 x 1.4 x 1.0 cm

Composition: solid/almost completely solid (2)

Echogenicity: hypoechoic (2)

Shape: not taller-than-wide (0)

Margins: smooth (0)

Echogenic foci: none (0)

ACR TI-RADS total points: 4.

ACR TI-RADS risk category:  TR4 (4-6 points).

Significant change in size (>/= 20% in two dimensions and minimal
increase of 2 mm): No

Change in features: No

Change in ACR TI-RADS risk category: No

ACR TI-RADS recommendations:

Correlation with prior biopsy results.

_________________________________________________________

Nodule # 2:

Location: Right; Inferior

Maximum size: 1.1 cm; Other 2 dimensions: 1.0 x 0.7 cm

Composition: solid/almost completely solid (2)

Echogenicity: hypoechoic (2)

Shape: not taller-than-wide (0)

Margins: ill-defined (0)

Echogenic foci: none (0)

ACR TI-RADS total points: 4.

ACR TI-RADS risk category: TR4 (4-6 points).

ACR TI-RADS recommendations:

*Given size (>/= 1 - 1.4 cm) and appearance, a follow-up ultrasound
in 1 year should be considered based on TI-RADS criteria.

_________________________________________________________
IMPRESSION: 1. Unchanged appearance of previously visualized and biopsied right
superior thyroid nodule measuring up to 2.4 cm. Recommend
correlation with prior biopsy results.
2. Interval development of right inferior thyroid lobe nodule (#2,
TIRADS 4) measuring up to 1.1 cm. Recommend follow-up ultrasound in
1 year.

The above is in keeping with the ACR TI-RADS recommendations - [HOSPITAL] 6510;[DATE].

## 2021-09-18 ENCOUNTER — Ambulatory Visit
Admission: EM | Admit: 2021-09-18 | Discharge: 2021-09-18 | Disposition: A | Discharge status: Home / Self Care | Payer: Medicare Other | Attending: Emergency Medicine | Admitting: Emergency Medicine

## 2021-09-18 ENCOUNTER — Other Ambulatory Visit: Payer: Self-pay

## 2021-09-18 ENCOUNTER — Ambulatory Visit (INDEPENDENT_AMBULATORY_CARE_PROVIDER_SITE_OTHER): Payer: Medicare Other

## 2021-09-18 ENCOUNTER — Encounter: Payer: Self-pay | Admitting: Emergency Medicine

## 2021-09-18 DIAGNOSIS — J069 Acute upper respiratory infection, unspecified: Secondary | ICD-10-CM | POA: Diagnosis present

## 2021-09-18 DIAGNOSIS — Z7952 Long term (current) use of systemic steroids: Secondary | ICD-10-CM | POA: Insufficient documentation

## 2021-09-18 DIAGNOSIS — R059 Cough, unspecified: Secondary | ICD-10-CM | POA: Insufficient documentation

## 2021-09-18 DIAGNOSIS — I517 Cardiomegaly: Secondary | ICD-10-CM | POA: Insufficient documentation

## 2021-09-18 DIAGNOSIS — R0989 Other specified symptoms and signs involving the circulatory and respiratory systems: Secondary | ICD-10-CM | POA: Diagnosis present

## 2021-09-18 DIAGNOSIS — J029 Acute pharyngitis, unspecified: Secondary | ICD-10-CM

## 2021-09-18 DIAGNOSIS — I700 Atherosclerosis of aorta: Secondary | ICD-10-CM | POA: Insufficient documentation

## 2021-09-18 DIAGNOSIS — Z20822 Contact with and (suspected) exposure to covid-19: Secondary | ICD-10-CM | POA: Diagnosis not present

## 2021-09-18 DIAGNOSIS — Z79899 Other long term (current) drug therapy: Secondary | ICD-10-CM | POA: Insufficient documentation

## 2021-09-18 LAB — GROUP A STREP BY PCR: Group A Strep by PCR: NOT DETECTED

## 2021-09-18 LAB — SARS CORONAVIRUS 2 BY RT PCR: SARS Coronavirus 2 by RT PCR: NEGATIVE

## 2021-09-18 MED ORDER — PREDNISONE 20 MG PO TABS: 60.0000 mg | ORAL_TABLET | Freq: Every day | ORAL | 0 refills | Status: AC

## 2021-09-18 MED ORDER — ALBUTEROL SULFATE HFA 108 (90 BASE) MCG/ACT IN AERS: 2.0000 | INHALATION_SPRAY | RESPIRATORY_TRACT | 0 refills | Status: DC | PRN

## 2021-09-18 MED ORDER — IPRATROPIUM BROMIDE 0.06 % NA SOLN: 2.0000 | Freq: Four times a day (QID) | NASAL | 12 refills | Status: DC

## 2021-09-18 MED ORDER — BENZONATATE 100 MG PO CAPS: 200.0000 mg | ORAL_CAPSULE | Freq: Three times a day (TID) | ORAL | 0 refills | Status: DC

## 2021-09-18 MED ORDER — PROMETHAZINE-DM 6.25-15 MG/5ML PO SYRP: 5.0000 mL | ORAL_SOLUTION | Freq: Four times a day (QID) | ORAL | 0 refills | Status: DC | PRN

## 2021-09-18 MED ORDER — AEROCHAMBER MV MISC: 2 refills | Status: DC

## 2021-09-18 NOTE — Discharge Instructions (Addendum)
Your testing today was negative for both COVID and strep.  Your chest x-ray did not show any signs of pneumonia but it does show chronic pulmonary hyperinflation which could be indications of COPD.  Especially in the setting of you being a former smoker.  There also is very mild enlargement of your heart size.  I am going to discharge you home with several medications to help your symptoms to include an albuterol inhaler to help you with your shortness of breath and wheezing, after nasal spray to help with your congestion, Tessalon Perles and Promethazine DM cough syrup to help with cough and congestion.  I muscular put you on a short dose of prednisone to help with your pulmonary inflammation.  Take 60 mg of prednisone daily at breakfast time for the next 5 days.  You can start when you pick up your medication today just make sure that you eat first.  Take it at breakfast going forward for the next 4 days.  Use the albuterol inhaler with a spacer, 2 puffs every 4-6 hours, as needed for wheezing or shortness of breath.  You can also use this if you are coughing as cough is sometimes an indication of pulmonary inflammation.  Use the Atrovent nasal spray, 2 squirts in each nostril every 6 hours, as needed for runny nose and postnasal drip.  Use the Tessalon Perles every 8 hours during the day.  Take them with a small sip of water.  They may give you some numbness to the base of your tongue or a metallic taste in your mouth, this is normal.  Use the Promethazine DM cough syrup at bedtime for cough and congestion.  It will make you drowsy so do not take it during the day.  Return for reevaluation or see your primary care provider for any new or worsening symptoms.  If you develop any increasing shortness of breath or chest pain please go to the ER for evaluation.  Otherwise, you need to follow-up with your primary care provider to discuss the enlarged heart size and chronic pulmonary hyperinflation.

## 2021-09-18 NOTE — ED Triage Notes (Signed)
Pt c/o sore throat, cough, nasal congestion, runny nose, and fever. Started about 5 days ago. She states she has been taking ibuprofen. She states she has taken 2 home covid test and were negative.

## 2021-09-18 NOTE — ED Provider Notes (Addendum)
[facility]    CSN: [id number] Arrival date & time: 09/18/21  0808      History   Chief Complaint Chief Complaint  Patient presents with   Sore Throat   Otalgia    bilateral    HPI Sonia Huang is a 76 y.o. female.   HPI  76 year old female here for evaluation respiratory complaints.  Patient reports that her symptoms been going on for last 5 days and consist predominantly of a very sore throat, runny nose, mild nasal congestion, ear itching and pain, itchy eyes, cough, and wheezing.  Her cough is intermittently productive.  She denies any shortness of breath or GI symptoms.  She has not been around anyone with similar symptoms that she is aware of.  She states that yesterday she had a fever somewhere between 100.3 and 101.  She is afebrile in clinic today.  Patient is in no acute distress and she is able to speak in full sentences though she does have a room air SPO2 of 91% on the wall unit and 93% on the Dinamap.  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   Eczema 11/15/2013   Menopause 11/15/2013   Neuralgia 11/15/2013   Nocturia 11/15/2013   Palpitations 11/15/2013   Tingling 11/15/2013   Vaginal wall prolapse 11/15/2013   Alcohol ingestion 05/01/2013   Breast cyst 05/01/2013   Thyroid nodule 04/27/2012   Multiple thyroid nodules 04/25/2012   Mixed hyperlipidemia 01/14/2011    Past Surgical History:  Procedure Laterality Date   CARPAL TUNNEL  RELEASE Right 2006   CHOLECYSTECTOMY  2000   COLONOSCOPY  2011   DILATION AND CURETTAGE OF UTERUS  1983   INCISION AND DRAINAGE BREAST ABSCESS Left 2010   TUBAL LIGATION  2010    OB History     Gravida  1   Para  1   Term      Preterm      AB      Living  1      SAB      IAB      Ectopic      Multiple      Live Births           Obstetric Comments  FIRST PREGNANCY 22 FIRST MENSTRUAL 14 LMP 1995          Home Medications    Prior to Admission medications   Medication Sig Start Date End Date Taking? Authorizing Provider  albuterol (VENTOLIN HFA) 108 (90 Base) MCG/ACT inhaler Inhale 2 puffs into the lungs every 4 (four) hours as needed. 09/18/21  Yes Becky Augusta, NP  atorvastatin (LIPITOR) 80 MG tablet Take 80 mg by mouth daily. 08/31/21  Yes [provider]  benzonatate (TESSALON) 100 MG capsule Take 2 capsules (200 mg total) by mouth every 8 (eight) hours. 09/18/21  Yes Becky Augusta, NP  cyanocobalamin (VITAMIN B12) 1000  MCG tablet Take by mouth.   Yes [provider]  EPINEPHrine 0.3 mg/0.3 mL IJ SOAJ injection Inject 0.3 mLs (0.3 mg total) into the muscle as needed for anaphylaxis. 08/30/19  Yes Merlyn Lot, MD  escitalopram (LEXAPRO) 20 MG tablet Take 20 mg by mouth daily.   Yes [provider]  ipratropium (ATROVENT) 0.06 % nasal spray Place 2 sprays into both nostrils 4 (four) times daily. 09/18/21  Yes Margarette Canada, NP  predniSONE (DELTASONE) 20 MG tablet Take 3 tablets (60 mg total) by mouth daily with breakfast for 5 days. 3 tablets daily for 5 days. 09/18/21 09/23/21 Yes Margarette Canada, NP  promethazine-dextromethorphan (PROMETHAZINE-DM) 6.25-15 MG/5ML syrup Take 5 mLs by mouth 4 (four) times daily as needed. 09/18/21  Yes Margarette Canada, NP  Spacer/Aero-Holding Chambers (AEROCHAMBER MV) inhaler Use as instructed 09/18/21  Yes Margarette Canada, NP  aspirin 81 MG tablet Take 81 mg by mouth daily.    [provider]  Calcium  Carbonate-Vitamin D (CALCIUM + D PO) Take 1,200 mg by mouth daily.     [provider]  pravastatin (PRAVACHOL) 20 MG tablet Take 40 mg by mouth daily.     [provider]    Family History Family History  Problem Relation Age of Onset   Cancer Sister        mouth    Social History Social History   Tobacco Use   Smoking status: Former    Packs/day: 2.00    Years: 40.00    Total pack years: 80.00    Types: Cigarettes   Smokeless tobacco: Never  Vaping Use   Vaping Use: Never used  Substance Use Topics   Alcohol use: Yes   Drug use: No     Allergies   Patient has no known allergies.   Review of Systems Review of Systems  Constitutional:  Positive for fever.  HENT:  Positive for congestion, ear pain, rhinorrhea and sore throat.   Respiratory:  Positive for cough and wheezing. Negative for shortness of breath.   Gastrointestinal:  Negative for diarrhea, nausea and vomiting.  Skin:  Negative for rash.  Hematological: Negative.   Psychiatric/Behavioral: Negative.       Physical Exam Triage Vital Signs ED Triage Vitals  Enc Vitals Group     BP      Pulse      Resp      Temp      Temp src      SpO2      Weight      Height      Head Circumference      Peak Flow      Pain Score      Pain Loc      Pain Edu?      Excl. in [place]?    No data found.  Updated Vital Signs BP 130/65 (BP Location: Left Arm)   Pulse 73   Temp 98.8 F (37.1 C) (Oral)   Resp 18   Ht 5\' 4"  (1.626 m)   Wt 166 lb 3.6 oz (75.4 kg)   SpO2 91%   BMI 28.53 kg/m   Visual Acuity Right Eye Distance:   Left Eye Distance:   Bilateral Distance:    Right Eye Near:   Left Eye Near:    Bilateral Near:     Physical Exam Vitals and nursing note reviewed.  Constitutional:      Appearance: Normal appearance. She is not ill-appearing.  HENT:  Head: Normocephalic and atraumatic.     Right Ear: Tympanic membrane, ear canal and external ear normal. There is no impacted  cerumen.     Left Ear: Tympanic membrane, ear canal and external ear normal. There is no impacted cerumen.     Nose: Congestion present. No rhinorrhea.     Mouth/Throat:     Mouth: Mucous membranes are moist.     Pharynx: Oropharynx is clear. Posterior oropharyngeal erythema present. No oropharyngeal exudate.  Cardiovascular:     Rate and Rhythm: Normal rate and regular rhythm.     Pulses: Normal pulses.     Heart sounds: Normal heart sounds. No murmur heard.    No friction rub. No gallop.  Pulmonary:     Effort: Pulmonary effort is normal.     Breath sounds: Normal breath sounds. No wheezing, rhonchi or rales.  Musculoskeletal:     Cervical back: Normal range of motion and neck supple.  Lymphadenopathy:     Cervical: No cervical adenopathy.  Skin:    General: Skin is warm and dry.     Capillary Refill: Capillary refill takes less than 2 seconds.     Findings: No erythema or rash.  Neurological:     General: No focal deficit present.     Mental Status: She is alert and oriented to person, place, and time.  Psychiatric:        Mood and Affect: Mood normal.        Behavior: Behavior normal.        Thought Content: Thought content normal.        Judgment: Judgment normal.      UC Treatments / Results  Labs (all labs ordered are listed, but only abnormal results are displayed) Labs Reviewed  GROUP A STREP BY PCR  SARS CORONAVIRUS 2 BY RT PCR    EKG   Radiology DG Chest 2 View  Result Date: 09/18/2021 CLINICAL DATA:  76 year old female with sore throat, cough, congestion, fever. EXAM: CHEST - 2 VIEW COMPARISON:  Chest radiographs 05/29/2016. FINDINGS: Chronic pulmonary hyperinflation. Borderline to mild cardiomegaly now. Other mediastinal contours are within normal limits. Visualized tracheal air column is within normal limits. No pneumothorax, pulmonary edema, pleural effusion or confluent pulmonary opacity. Abdominal Calcified aortic atherosclerosis. No acute osseous  abnormality identified. Negative visible bowel gas. IMPRESSION: 1. Chronic pulmonary hyperinflation suspected. No acute pulmonary abnormality. 2. Mild cardiomegaly since 2018. Aortic Atherosclerosis (ICD10-I70.0). Electronically Signed   By: Genevie Ann M.D.   On: 09/18/2021 09:10    Procedures Procedures (including critical care time)  Medications Ordered in UC Medications - No data to display  Initial Impression / Assessment and Plan / UC Course  I have reviewed the triage vital signs and the nursing notes.  Pertinent labs & imaging results that were available during my care of the patient were reviewed by me and considered in my medical decision making (see chart for details).   Patient is a nontoxic-appearing 76 year old female here for evaluation Rester complaints outlined HPI above.  As stated above, patient is not in any respiratory distress and does not demonstrate any dyspnea or tachypnea.  She is able to speak in full sentences without difficulty.  Her oxygen saturation is low at 91% on her 1 unit and 93% using the Dinamap.  She does not endorse any shortness of breath she states she does have some intermittent wheezing and she has a cough that is intermittently productive.  This is also associated with some upper  respiratory symptoms.  Her exam reveals pearly-gray tympanic membranes bilaterally with normal light reflex.  Her external auditory canals are narrow but they are free of erythema.  Nasal mucosa is mildly erythematous and edematous.  No appreciable discharge noted.  Oropharyngeal exam reveals very mild posterior oropharyngeal erythema with clear postnasal drip.  No anterior cervical lymphadenopathy appreciable exam.  Cardiopulmonary exam reveals clear lung sounds in all fields.  Patient has normal chest excursion.  Given the patient's symptoms have been going on for 5 days a COVID swab was collected at triage as well as a strep PCR.  Most can order chest x-ray to look for any acute  cardiopulmonary pathology that would explain the hypoxia.  COVID PCR is negative.  Strep PCR is negative.  Chest x-ray independent reviewed and evaluated by me.  Impression: The lung fields are well aerated and there is no evidence of infiltrate or effusion noted.  The costophrenic angles are crisp.  Questionable mild enlargement of the heart size as it appears to be slightly wider than 50% of the chest diameter.  Radiology overread is pending. Radiology findings state chronic pulmonary hyperinflation with borderline to mild cardiomegaly now.  Mediastinal contours are within normal limits.  Visualized trachea volumes are within normal limits.  No pneumothorax, pulm edema, pleural effusion, or confluent pulmonary opacity.  Aortic atherosclerosis is present.  I will discharge patient with a diagnosis of viral URI.  I also suspect that she has some COPD given that she is a former smoker.  I will discharge her home on an albuterol inhaler to help her with her shortness of breath and have her follow-up with her PCP regarding her mild cardiomegaly and chronic pulmonary hyperinflation.  I will also discharge patient home on a 5-day burst dose of 60 mg/day prednisone for pulmonary inflammation.  Final Clinical Impressions(s) / UC Diagnoses   Final diagnoses:  Viral URI with cough  Pulmonary hyperinflation     Discharge Instructions      Your testing today was negative for both COVID and strep.  Your chest x-ray did not show any signs of pneumonia but it does show chronic pulmonary hyperinflation which could be indications of COPD.  Especially in the setting of you being a former smoker.  There also is very mild enlargement of your heart size.  I am going to discharge you home with several medications to help your symptoms to include an albuterol inhaler to help you with your shortness of breath and wheezing, after nasal spray to help with your congestion, Tessalon Perles and Promethazine DM cough  syrup to help with cough and congestion.  I muscular put you on a short dose of prednisone to help with your pulmonary inflammation.  Take 60 mg of prednisone daily at breakfast time for the next 5 days.  You can start when you pick up your medication today just make sure that you eat first.  Take it at breakfast going forward for the next 4 days.  Use the albuterol inhaler with a spacer, 2 puffs every 4-6 hours, as needed for wheezing or shortness of breath.  You can also use this if you are coughing as cough is sometimes an indication of pulmonary inflammation.  Use the Atrovent nasal spray, 2 squirts in each nostril every 6 hours, as needed for runny nose and postnasal drip.  Use the Tessalon Perles every 8 hours during the day.  Take them with a small sip of water.  They may give you some numbness  to the base of your tongue or a metallic taste in your mouth, this is normal.  Use the Promethazine DM cough syrup at bedtime for cough and congestion.  It will make you drowsy so do not take it during the day.  Return for reevaluation or see your primary care provider for any new or worsening symptoms.  If you develop any increasing shortness of breath or chest pain please go to the ER for evaluation.  Otherwise, you need to follow-up with your primary care provider to discuss the enlarged heart size and chronic pulmonary hyperinflation.     ED Prescriptions     Medication Sig Dispense Auth. Provider   albuterol (VENTOLIN HFA) 108 (90 Base) MCG/ACT inhaler Inhale 2 puffs into the lungs every 4 (four) hours as needed. 18 g Becky Augusta, NP   Spacer/Aero-Holding Chambers (AEROCHAMBER MV) inhaler Use as instructed 1 each Becky Augusta, NP   benzonatate (TESSALON) 100 MG capsule Take 2 capsules (200 mg total) by mouth every 8 (eight) hours. 21 capsule Becky Augusta, NP   ipratropium (ATROVENT) 0.06 % nasal spray Place 2 sprays into both nostrils 4 (four) times daily. 15 mL Becky Augusta, NP    predniSONE (DELTASONE) 20 MG tablet Take 3 tablets (60 mg total) by mouth daily with breakfast for 5 days. 3 tablets daily for 5 days. 15 tablet Becky Augusta, NP   promethazine-dextromethorphan (PROMETHAZINE-DM) 6.25-15 MG/5ML syrup Take 5 mLs by mouth 4 (four) times daily as needed. 118 mL Becky Augusta, NP      PDMP not reviewed this encounter.   Becky Augusta, NP 09/18/21 1000    Becky Augusta, NP 09/18/21 1000

## 2021-10-22 ENCOUNTER — Other Ambulatory Visit (HOSPITAL_COMMUNITY): Payer: Self-pay | Admitting: Family Medicine

## 2021-10-22 ENCOUNTER — Other Ambulatory Visit: Payer: Self-pay | Admitting: Family Medicine

## 2021-10-22 DIAGNOSIS — E042 Nontoxic multinodular goiter: Secondary | ICD-10-CM

## 2021-12-03 ENCOUNTER — Ambulatory Visit
Admission: RE | Admit: 2021-12-03 | Discharge: 2021-12-03 | Disposition: A | Discharge status: Home / Self Care | Payer: Medicare Other | Source: Ambulatory Visit | Attending: Family Medicine | Admitting: Family Medicine

## 2021-12-03 DIAGNOSIS — E042 Nontoxic multinodular goiter: Secondary | ICD-10-CM | POA: Diagnosis present

## 2023-07-24 ENCOUNTER — Ambulatory Visit
Admission: EM | Admit: 2023-07-24 | Discharge: 2023-07-24 | Disposition: A | Discharge status: Home / Self Care | Attending: Physician Assistant | Admitting: Physician Assistant

## 2023-07-24 ENCOUNTER — Encounter: Payer: Self-pay | Admitting: Emergency Medicine

## 2023-07-24 DIAGNOSIS — S60460A Insect bite (nonvenomous) of right index finger, initial encounter: Secondary | ICD-10-CM | POA: Diagnosis not present

## 2023-07-24 DIAGNOSIS — M7989 Other specified soft tissue disorders: Secondary | ICD-10-CM

## 2023-07-24 DIAGNOSIS — W57XXXA Bitten or stung by nonvenomous insect and other nonvenomous arthropods, initial encounter: Secondary | ICD-10-CM | POA: Diagnosis not present

## 2023-07-24 MED ORDER — PREDNISONE 20 MG PO TABS: 40.0000 mg | ORAL_TABLET | Freq: Every day | ORAL | 0 refills | Status: AC

## 2023-07-24 MED ORDER — HYDROXYZINE HCL 25 MG PO TABS: 25.0000 mg | ORAL_TABLET | Freq: Four times a day (QID) | ORAL | 0 refills | Status: DC | PRN

## 2023-07-24 NOTE — ED Provider Notes (Signed)
 [facility]    CSN: [id number] Arrival date & time: 07/24/23  0902      History   Chief Complaint Chief Complaint  Patient presents with   Insect Bite    Right 2nd finger    HPI Sonia Huang is a 78 y.o. female right index finger swelling, redness, itching and rash following a bee sting at 11 AM yesterday.  Patient has taken Claritin without relief.  She is concerned because she had a serious reaction to some sort of insect before that affected her bleeding and she does not want that to happen again.  She currently denies any other areas of rash or swelling, throat swelling/tightness, shortness of breath or chest tightness.  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   Eczema 11/15/2013   Menopause 11/15/2013   Neuralgia 11/15/2013   Nocturia 11/15/2013   Palpitations 11/15/2013   Tingling 11/15/2013   Vaginal wall prolapse 11/15/2013   Alcohol ingestion 05/01/2013   Breast cyst 05/01/2013   Thyroid  nodule 04/27/2012   Multiple thyroid  nodules 04/25/2012   Mixed hyperlipidemia 01/14/2011    Past Surgical History:  Procedure Laterality Date   CARPAL TUNNEL RELEASE Right 2006   CHOLECYSTECTOMY  2000   COLONOSCOPY  2011   DILATION AND CURETTAGE OF UTERUS  1983   INCISION AND DRAINAGE BREAST ABSCESS Left 2010   TUBAL LIGATION  2010    OB History     Gravida  1   Para  1   Term      Preterm      AB      Living  1      SAB      IAB       Ectopic      Multiple      Live Births           Obstetric Comments  FIRST PREGNANCY 22 FIRST MENSTRUAL 14 LMP 1995          Home Medications    Prior to Admission medications   Medication Sig Start Date End Date Taking? Authorizing Provider  hydrOXYzine  (ATARAX ) 25 MG tablet Take 1 tablet (25 mg total) by mouth every 6 (six) hours as needed. 07/24/23  Yes Arvis Jolan NOVAK, PA-C  predniSONE  (DELTASONE ) 20 MG tablet Take 2 tablets (40 mg total) by mouth daily for 3 days. 07/24/23 07/27/23 Yes Arvis Jolan NOVAK, PA-C  albuterol  (VENTOLIN  HFA) 108 (90 Base) MCG/ACT inhaler Inhale 2 puffs into the lungs every 4 (four) hours as needed. 09/18/21   Bernardino Ditch, NP  aspirin 81 MG tablet Take 81 mg by mouth daily.    [provider]  atorvastatin (LIPITOR) 80 MG tablet Take 80 mg by mouth daily. 08/31/21   [provider]  Calcium Carbonate-Vitamin D (CALCIUM + D PO) Take 1,200 mg by mouth daily.     [provider]  cyanocobalamin (VITAMIN B12) 1000 MCG tablet Take by mouth.    [provider]  EPINEPHrine  0.3 mg/0.3 mL IJ SOAJ injection Inject 0.3 mLs (0.3 mg total) into the muscle as needed for anaphylaxis. 08/30/19   Lang Dover, MD  escitalopram (LEXAPRO) 20 MG tablet Take 20 mg by mouth daily.    [provider]  ipratropium (ATROVENT ) 0.06 % nasal spray Place 2 sprays into both nostrils 4 (four) times daily. 09/18/21   Bernardino Ditch, NP  pravastatin (PRAVACHOL) 20 MG tablet Take 40 mg by mouth daily.     [provider]  promethazine -dextromethorphan (PROMETHAZINE -DM) 6.25-15 MG/5ML syrup Take 5 mLs by mouth 4 (four) times daily as needed. 09/18/21   Bernardino Ditch, NP  Spacer/Aero-Holding [place] (AEROCHAMBER MV) inhaler Use as instructed 09/18/21   Bernardino Ditch, NP    Family History Family History  Problem Relation Age of Onset   Cancer Sister        mouth    Social History Social History   Tobacco Use   Smoking status:  Former    Current packs/day: 2.00    Average packs/day: 2.0 packs/day for 40.0 years (80.0 ttl pk-yrs)    Types: Cigarettes   Smokeless tobacco: Never  Vaping Use   Vaping status: Never Used  Substance Use Topics   Alcohol use: Yes   Drug use: No     Allergies   Patient has no known allergies.   Review of Systems Review of Systems  Musculoskeletal:  Positive for joint swelling. Negative for arthralgias.  Skin:  Positive for color change. Negative for wound.  Neurological:  Negative for weakness and numbness.     Physical Exam Triage Vital Signs ED Triage Vitals  Encounter Vitals Group     BP 07/24/23 0914 (!) 135/51     Girls Systolic BP Percentile --      Girls Diastolic BP Percentile --      Boys Systolic BP Percentile --      Boys Diastolic BP Percentile --      Pulse Rate 07/24/23 0914 72     Resp 07/24/23 0914 14     Temp 07/24/23 0914 98.8 F (37.1 C)     Temp Source 07/24/23 0914 Oral     SpO2 07/24/23 0914 93 %     Weight 07/24/23 0912 166 lb 3.6 oz (75.4 kg)     Height 07/24/23 0912 5' 4 (1.626 m)     Head Circumference --      Peak Flow --      Pain Score 07/24/23 0912 0     Pain Loc --      Pain Education --      Exclude from Growth Chart --    No data found.  Updated Vital Signs BP (!) 135/51 (BP Location: Left Arm)   Pulse 72   Temp 98.8 F (37.1 C) (Oral)   Resp 14   Ht 5' 4 (1.626 m)   Wt 166 lb 3.6 oz (75.4 kg)   SpO2 93%   BMI 28.53 kg/m   Physical Exam Vitals and nursing note reviewed.  Constitutional:      General: She is not in acute distress.    Appearance: Normal appearance. She is not ill-appearing or toxic-appearing.  HENT:     Head: Normocephalic and atraumatic.     Nose: Nose normal.     Mouth/Throat:     Mouth: Mucous membranes are moist.     Pharynx: Oropharynx is clear.  Comments: No intraoral swelling Eyes:     General: No scleral icterus.       Right eye: No discharge.        Left eye: No discharge.      Conjunctiva/sclera: Conjunctivae normal.  Cardiovascular:     Rate and Rhythm: Normal rate and regular rhythm.  Pulmonary:     Effort: Pulmonary effort is normal. No respiratory distress.     Breath sounds: Normal breath sounds.  Musculoskeletal:     Cervical back: Neck supple.  Skin:    General: Skin is dry.     Findings: Rash (+swelling, erythema and rash of right index finger. Full ROM. No tenderness) present.  Neurological:     General: No focal deficit present.     Mental Status: She is alert. Mental status is at baseline.     Motor: No weakness.     Gait: Gait normal.  Psychiatric:        Mood and Affect: Mood normal.        Behavior: Behavior normal.      UC Treatments / Results  Labs (all labs ordered are listed, but only abnormal results are displayed) Labs Reviewed - No data to display  EKG   Radiology No results found.  Procedures Procedures (including critical care time)  Medications Ordered in UC Medications - No data to display  Initial Impression / Assessment and Plan / UC Course  I have reviewed the triage vital signs and the nursing notes.  Pertinent labs & imaging results that were available during my care of the patient were reviewed by me and considered in my medical decision making (see chart for details).   78 year old female presents for right index finger swelling, redness and rash after bee sting yesterday.  History of significant swelling and shortness of breath to previous insect sting.  Not experiencing any of those symptoms now.  Taking Claritin.  On evaluation she has rash, redness and swelling of the right index finger but no other abnormalities on exam.  Patient prescribed hydroxyzine  and 3-day course of prednisone  for localized allergic reaction to bee sting.  Advised cryotherapy and elevation.  Reviewed return precautions.  ED precautions also discussed.   Final Clinical Impressions(s) / UC Diagnoses   Final diagnoses:  Hand  swelling  Insect bite of right index finger, initial encounter     Discharge Instructions      - I sent medication for hydroxyzine  for itching.  It is an antihistamine.  You can also continue Claritin. - 3-day course of prednisone  to help with swelling. - Ice to hand and elevate it. - If you feel symptoms are getting worse or not improving in the next 2 days may return for reevaluation.     ED Prescriptions     Medication Sig Dispense Auth. Provider   hydrOXYzine  (ATARAX ) 25 MG tablet Take 1 tablet (25 mg total) by mouth every 6 (six) hours as needed. 30 tablet Arvis Huxley B, PA-C   predniSONE  (DELTASONE ) 20 MG tablet Take 2 tablets (40 mg total) by mouth daily for 3 days. 6 tablet Erionna Strum B, PA-C      PDMP not reviewed this encounter.   Arvis Huxley B, PA-C 07/24/23 [id number]

## 2023-07-24 NOTE — ED Triage Notes (Signed)
 Patient states that a bee stung her right 2nd finger at 11 am yesterday morning.  Patient has redness, itching, and swelling in her right 2nd finger.  Patient denies any tongue swelling or SOB.

## 2023-07-24 NOTE — Discharge Instructions (Signed)
-   I sent medication for hydroxyzine  for itching.  It is an antihistamine.  You can also continue Claritin. - 3-day course of prednisone  to help with swelling. - Ice to hand and elevate it. - If you feel symptoms are getting worse or not improving in the next 2 days may return for reevaluation.

## 2023-08-11 ENCOUNTER — Ambulatory Visit
Admission: EM | Admit: 2023-08-11 | Discharge: 2023-08-11 | Disposition: A | Discharge status: Home / Self Care | Attending: Emergency Medicine | Admitting: Emergency Medicine

## 2023-08-11 DIAGNOSIS — S29019A Strain of muscle and tendon of unspecified wall of thorax, initial encounter: Secondary | ICD-10-CM | POA: Diagnosis not present

## 2023-08-11 DIAGNOSIS — M7918 Myalgia, other site: Secondary | ICD-10-CM

## 2023-08-11 MED ORDER — METHOCARBAMOL 500 MG PO TABS: 500.0000 mg | ORAL_TABLET | Freq: Two times a day (BID) | ORAL | 0 refills | Status: AC | PRN

## 2023-08-11 NOTE — ED Provider Notes (Signed)
 [facility]    CSN: [id number] Arrival date & time: 08/11/23  0830      History   Chief Complaint Chief Complaint  Patient presents with   Shoulder Pain   Back Pain    HPI Sonia Huang is a 78 y.o. female.   78 year old female, Sonia Huang, presents to urgent care for evaluation of left shoulder pain and back pain for 2 weeks.  Patient states she been treating with Tylenol .  Patient seen at orthopedics recently for knee pain but did not mention her back or shoulder pain at that time.  Patient denies chest pain, palpitations, or shortness of breath    The history is provided by the patient. No language interpreter was used.    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   Eczema 11/15/2013   Menopause 11/15/2013   Neuralgia 11/15/2013   Nocturia 11/15/2013   Palpitations 11/15/2013   Tingling 11/15/2013   Vaginal wall prolapse 11/15/2013   Alcohol ingestion 05/01/2013   Breast cyst 05/01/2013   Thyroid  nodule 04/27/2012   Multiple thyroid  nodules 04/25/2012   Mixed hyperlipidemia 01/14/2011    Past Surgical History:  Procedure Laterality Date   CARPAL TUNNEL RELEASE Right 2006   CHOLECYSTECTOMY  2000   COLONOSCOPY  2011   DILATION AND CURETTAGE OF UTERUS  1983   INCISION AND DRAINAGE BREAST ABSCESS Left 2010   TUBAL LIGATION  2010    OB History     Gravida  1    Para  1   Term      Preterm      AB      Living  1      SAB      IAB      Ectopic      Multiple      Live Births           Obstetric Comments  FIRST PREGNANCY 23 FIRST MENSTRUAL 14 LMP 1995          Home Medications    Prior to Admission medications   Medication Sig Start Date End Date Taking? Authorizing Provider  methocarbamol  (ROBAXIN ) 500 MG tablet Take 1 tablet (500 mg total) by mouth every 12 (twelve) hours as needed for up to 7 days for muscle spasms. 08/11/23 08/18/23 Yes Tashika Goodin, Rilla, NP  albuterol  (VENTOLIN  HFA) 108 (90 Base) MCG/ACT inhaler Inhale 2 puffs into the lungs every 4 (four) hours as needed. 09/18/21   Bernardino Ditch, NP  aspirin 81 MG tablet Take 81 mg by mouth daily.    [provider]  atorvastatin (LIPITOR) 80 MG tablet Take 80 mg by mouth daily. 08/31/21   [provider]  Calcium Carbonate-Vitamin D (CALCIUM + D PO) Take 1,200 mg by mouth daily.     [provider]  cyanocobalamin (VITAMIN B12)  1000 MCG tablet Take by mouth.    [provider]  EPINEPHrine  0.3 mg/0.3 mL IJ SOAJ injection Inject 0.3 mLs (0.3 mg total) into the muscle as needed for anaphylaxis. 08/30/19   Lang Dover, MD  escitalopram (LEXAPRO) 20 MG tablet Take 20 mg by mouth daily.    [provider]  hydrOXYzine  (ATARAX ) 25 MG tablet Take 1 tablet (25 mg total) by mouth every 6 (six) hours as needed. 07/24/23   Arvis Huxley B, PA-C  ipratropium (ATROVENT ) 0.06 % nasal spray Place 2 sprays into both nostrils 4 (four) times daily. 09/18/21   Bernardino Ditch, NP  pravastatin (PRAVACHOL) 20 MG tablet Take 40 mg by mouth daily.     [provider]  promethazine -dextromethorphan (PROMETHAZINE -DM) 6.25-15 MG/5ML syrup Take 5 mLs by mouth 4 (four) times daily as needed. 09/18/21   Bernardino Ditch, NP  Spacer/Aero-Holding Raguel (AEROCHAMBER MV) inhaler Use as instructed 09/18/21   Bernardino Ditch, NP    Family History Family History   Problem Relation Age of Onset   Cancer Sister        mouth    Social History Social History   Tobacco Use   Smoking status: Former    Current packs/day: 2.00    Average packs/day: 2.0 packs/day for 40.0 years (80.0 ttl pk-yrs)    Types: Cigarettes   Smokeless tobacco: Never  Vaping Use   Vaping status: Never Used  Substance Use Topics   Alcohol use: Yes   Drug use: No     Allergies   Patient has no known allergies.   Review of Systems Review of Systems  Constitutional:  Negative for fever.  Musculoskeletal:  Positive for back pain and myalgias. Negative for neck pain and neck stiffness.  Skin: Negative.   All other systems reviewed and are negative.    Physical Exam Triage Vital Signs ED Triage Vitals  Encounter Vitals Group     BP      Girls Systolic BP Percentile      Girls Diastolic BP Percentile      Boys Systolic BP Percentile      Boys Diastolic BP Percentile      Pulse      Resp      Temp      Temp src      SpO2      Weight      Height      Head Circumference      Peak Flow      Pain Score      Pain Loc      Pain Education      Exclude from Growth Chart    No data found.  Updated Vital Signs BP 136/61 (BP Location: Left Arm)   Pulse 65   Temp 98.7 F (37.1 C) (Oral)   Resp 16   SpO2 94%   Visual Acuity Right Eye Distance:   Left Eye Distance:   Bilateral Distance:    Right Eye Near:   Left Eye Near:    Bilateral Near:     Physical Exam Vitals and nursing note reviewed.  Musculoskeletal:     Thoracic back: Spasms and tenderness present. No swelling, edema, deformity, signs of trauma, lacerations or bony tenderness. Normal range of motion. No scoliosis.       Back:  Neurological:     General: No focal deficit present.     Mental Status: She is alert and oriented to person, place, and time.  GCS: GCS eye subscore is 4. GCS verbal subscore is 5. GCS motor subscore is 6.  Psychiatric:        Attention and Perception:  Attention normal.        Mood and Affect: Mood normal.        Speech: Speech normal.        Behavior: Behavior normal.      UC Treatments / Results  Labs (all labs ordered are listed, but only abnormal results are displayed) Labs Reviewed - No data to display  EKG   Radiology No results found.  Procedures Procedures (including critical care time)  Medications Ordered in UC Medications - No data to display  Initial Impression / Assessment and Plan / UC Course  I have reviewed the triage vital signs and the nursing notes.  Pertinent labs & imaging results that were available during my care of the patient were reviewed by me and considered in my medical decision making (see chart for details).    Discussed exam findings and plan of care with patient, strict go to ER precautions given.   Patient verbalized understanding to this provider.  Ddx: Musculoskletal pain, Thoracic muscle strain, muscle spasm Final Clinical Impressions(s) / UC Diagnoses   Final diagnoses:  Musculoskeletal pain  Thoracic myofascial strain, initial encounter     Discharge Instructions      Take home meds as directed. Take robaxin  as directed for musculoskeletal/spasm.  May use heat or ice to back for comfort 20 min 3 x daily.  May use lidocaine patch or biofreeze for pain.  Please follow up with PCP, may need to referral to physical therapy for further back pain management.  GO immediately to nearest ER or call 9-1-1 for loss of bowel and bladder,loss of function, saddle numbness, etc.  Pt advised otc tylenol  as label directed for pain Avoid lifting,turning,bending as this will aggravate your back     ED Prescriptions     Medication Sig Dispense Auth. Provider   methocarbamol  (ROBAXIN ) 500 MG tablet Take 1 tablet (500 mg total) by mouth every 12 (twelve) hours as needed for up to 7 days for muscle spasms. 14 tablet Loukisha Gunnerson, Rilla, NP      PDMP not reviewed this encounter.    Aminta Rilla, NP 08/11/23 [phone]

## 2023-08-11 NOTE — ED Triage Notes (Signed)
 Patient presents to UC for left shoulder pain and back pain x 2 weeks. Treating with tylenol . Seen at ortho Monday for knee did not mention the back pain.

## 2023-08-11 NOTE — Discharge Instructions (Signed)
 Take home meds as directed. Take robaxin  as directed for musculoskeletal/spasm.  May use heat or ice to back for comfort 20 min 3 x daily.  May use lidocaine patch or biofreeze for pain.  Please follow up with PCP, may need to referral to physical therapy for further back pain management.  GO immediately to nearest ER or call 9-1-1 for loss of bowel and bladder,loss of function, saddle numbness, etc.  Pt advised otc tylenol  as label directed for pain Avoid lifting,turning,bending as this will aggravate your back

## 2023-09-22 ENCOUNTER — Other Ambulatory Visit: Payer: Self-pay | Admitting: Surgery

## 2023-09-28 ENCOUNTER — Other Ambulatory Visit: Payer: Self-pay

## 2023-09-28 ENCOUNTER — Encounter
Admission: RE | Admit: 2023-09-28 | Discharge: 2023-09-28 | Disposition: A | Discharge status: Home / Self Care | Source: Ambulatory Visit | Attending: Surgery | Admitting: Surgery

## 2023-09-28 VITALS — BP 151/64 | HR 70 | Resp 14 | Ht 64.0 in | Wt 166.9 lb

## 2023-09-28 DIAGNOSIS — Z01818 Encounter for other preprocedural examination: Secondary | ICD-10-CM | POA: Diagnosis present

## 2023-09-28 HISTORY — DX: Other specified health status: Z78.9

## 2023-09-28 HISTORY — DX: Unilateral primary osteoarthritis, right knee: M17.11

## 2023-09-28 HISTORY — DX: Family history of other specified conditions: Z84.89

## 2023-09-28 HISTORY — DX: Rectocele: N81.6

## 2023-09-28 HISTORY — DX: Unspecified cataract: H26.9

## 2023-09-28 HISTORY — DX: Gastro-esophageal reflux disease without esophagitis: K21.9

## 2023-09-28 HISTORY — DX: Anxiety disorder, unspecified: F41.9

## 2023-09-28 HISTORY — DX: Dislocation of jaw, unspecified side, initial encounter: S03.00XA

## 2023-09-28 LAB — CBC WITH DIFFERENTIAL/PLATELET
Abs Immature Granulocytes: 0.01 K/uL (ref 0.00–0.07)
Basophils Absolute: 0 K/uL (ref 0.0–0.1)
Basophils Relative: 1 %
Eosinophils Absolute: 0.1 K/uL (ref 0.0–0.5)
Eosinophils Relative: 2 %
HCT: 42 % (ref 36.0–46.0)
Hemoglobin: 14.3 g/dL (ref 12.0–15.0)
Immature Granulocytes: 0 %
Lymphocytes Relative: 19 %
Lymphs Abs: 1 K/uL (ref 0.7–4.0)
MCH: 32.5 pg (ref 26.0–34.0)
MCHC: 34 g/dL (ref 30.0–36.0)
MCV: 95.5 fL (ref 80.0–100.0)
Monocytes Absolute: 0.5 K/uL (ref 0.1–1.0)
Monocytes Relative: 10 %
Neutro Abs: 3.5 K/uL (ref 1.7–7.7)
Neutrophils Relative %: 68 %
Platelets: 157 K/uL (ref 150–400)
RBC: 4.4 MIL/uL (ref 3.87–5.11)
RDW: 13.5 % (ref 11.5–15.5)
WBC: 5.1 K/uL (ref 4.0–10.5)
nRBC: 0 % (ref 0.0–0.2)

## 2023-09-28 LAB — URINALYSIS, ROUTINE W REFLEX MICROSCOPIC
Bilirubin Urine: NEGATIVE
Glucose, UA: NEGATIVE mg/dL
Hgb urine dipstick: NEGATIVE
Ketones, ur: NEGATIVE mg/dL
Leukocytes,Ua: NEGATIVE
Nitrite: NEGATIVE
Protein, ur: NEGATIVE mg/dL
Specific Gravity, Urine: 1.018 (ref 1.005–1.030)
pH: 5 (ref 5.0–8.0)

## 2023-09-28 LAB — COMPREHENSIVE METABOLIC PANEL WITH GFR
ALT: 33 U/L (ref 0–44)
AST: 34 U/L (ref 15–41)
Albumin: 4 g/dL (ref 3.5–5.0)
Alkaline Phosphatase: 60 U/L (ref 38–126)
Anion gap: 10 (ref 5–15)
BUN: 12 mg/dL (ref 8–23)
CO2: 30 mmol/L (ref 22–32)
Calcium: 9.8 mg/dL (ref 8.9–10.3)
Chloride: 101 mmol/L (ref 98–111)
Creatinine, Ser: 0.42 mg/dL — ABNORMAL LOW (ref 0.44–1.00)
GFR, Estimated: >60 mL/min (ref >60)
Glucose, Bld: 105 mg/dL — ABNORMAL HIGH (ref 70–99)
Potassium: 4.5 mmol/L (ref 3.5–5.1)
Sodium: 141 mmol/L (ref 135–145)
Total Bilirubin: 1 mg/dL (ref 0.0–1.2)
Total Protein: 6.6 g/dL (ref 6.5–8.1)

## 2023-09-28 LAB — SURGICAL PCR SCREEN
MRSA, PCR: NEGATIVE
Staphylococcus aureus: NEGATIVE

## 2023-09-28 NOTE — Patient Instructions (Addendum)
 Your procedure is scheduled on:10-06-23 Thursday Report to the Registration Desk on the 1st floor of the [facility].Then proceed to the 2nd floor Surgery Desk To find out your arrival time, please call [phone] between 1PM - 3PM on:10-05-23 Wednesday If your arrival time is 6:00 am, do not arrive before that time as the [facility] entrance doors do not open until 6:00 am.  REMEMBER: Instructions that are not followed completely may result in serious medical risk, up to and including death; or upon the discretion of your surgeon and anesthesiologist your surgery may need to be rescheduled.  Do not eat food after midnight the night before surgery.  No gum chewing or hard candies.  You may however, drink CLEAR liquids up to 2 hours before you are scheduled to arrive for your surgery. Do not drink anything within 2 hours of your scheduled arrival time.  Clear liquids include: - water  - apple juice without pulp - gatorade (not RED colors) - black coffee or tea (Do NOT add milk or creamers to the coffee or tea) Do NOT drink anything that is not on this list  In addition, your doctor has ordered for you to drink the provided:  Ensure Pre-Surgery Clear Carbohydrate Drink  Drinking this carbohydrate drink up to two hours before surgery helps to reduce insulin resistance and improve patient outcomes. Please complete drinking 2 hours before scheduled arrival time.  One week prior to surgery:Stop NOW (09-28-23) Stop Anti-inflammatories (NSAIDS) such as Advil, Aleve, Ibuprofen, Motrin, Naproxen, Naprosyn and Aspirin based products such as Excedrin, Goody's Powder, BC Powder. Stop ANY OVER THE COUNTER supplements until after surgery (Calcium, Multivitamin)  You may however, continue to take Tylenol  if needed for pain up until the day of surgery.  Continue taking all of your other prescription medications up until the day of surgery.  ON THE DAY OF SURGERY ONLY TAKE THESE MEDICATIONS WITH  SIPS OF WATER: -escitalopram (LEXAPRO)   Continue your 81 mg Aspirin up until the day prior to surgery-Do NOT take the morning of surgery  No Alcohol for 24 hours before or after surgery.  No Smoking including e-cigarettes for 24 hours before surgery.  No chewable tobacco products for at least 6 hours before surgery.  No nicotine patches on the day of surgery.  Do not use any recreational drugs for at least a week (preferably 2 weeks) before your surgery.  Please be advised that the combination of cocaine and anesthesia may have negative outcomes, up to and including death. If you test positive for cocaine, your surgery will be cancelled.  On the morning of surgery brush your teeth with toothpaste and water, you may rinse your mouth with mouthwash if you wish. Do not swallow any toothpaste or mouthwash.  Use CHG Soap as directed on instruction sheet.  Do not wear jewelry, make-up, hairpins, clips or nail polish.  For welded (permanent) jewelry: bracelets, anklets, waist bands, etc.  Please have this removed prior to surgery.  If it is not removed, there is a chance that hospital personnel will need to cut it off on the day of surgery.  Do not wear lotions, powders, or perfumes.   Do not shave body hair from the neck down 48 hours before surgery.  Contact lenses, hearing aids and dentures may not be worn into surgery.  Do not bring valuables to the hospital. [facility] is not responsible for any missing/lost belongings or valuables.   Notify your doctor if there is any  change in your medical condition (cold, fever, infection).  Wear comfortable clothing (specific to your surgery type) to the hospital.  After surgery, you can help prevent lung complications by doing breathing exercises.  Take deep breaths and cough every 1-2 hours. Your doctor may order a device called an Incentive Spirometer to help you take deep breaths. When coughing or sneezing, hold a pillow firmly against  your incision with both hands. This is called "splinting." Doing this helps protect your incision. It also decreases belly discomfort.  If you are being admitted to the hospital overnight, leave your suitcase in the car. After surgery it may be brought to your room.  In case of increased patient census, it may be necessary for you, the patient, to continue your postoperative care in the Same Day Surgery department.  If you are being discharged the day of surgery, you will not be allowed to drive home. You will need a responsible individual to drive you home and stay with you for 24 hours after surgery.   If you are taking public transportation, you will need to have a responsible individual with you.  Please call the Pre-admissions Testing Dept. at [phone] if you have any questions about these instructions.  Surgery Visitation Policy:  Patients having surgery or a procedure may have two visitors.  Children under the age of 25 must have an adult with them who is not the patient.  Inpatient Visitation:    Visiting hours are 7 a.m. to 8 p.m. Up to four visitors are allowed at one time in a patient room. The visitors may rotate out with other people during the day.  One visitor age 8 or older may stay with the patient overnight and must be in the room by 8 p.m.    Pre-operative 5 CHG Bath Instructions   You can play a key role in reducing the risk of infection after surgery. Your skin needs to be as free of germs as possible. You can reduce the number of germs on your skin by washing with CHG (chlorhexidine gluconate) soap before surgery. CHG is an antiseptic soap that kills germs and continues to kill germs even after washing.   DO NOT use if you have an allergy to chlorhexidine/CHG or antibacterial soaps. If your skin becomes reddened or irritated, stop using the CHG and notify one of our RNs at [phone].   Please shower with the CHG soap starting 4 days before surgery using  the following schedule:     Please keep in mind the following:  DO NOT shave, including legs and underarms, starting the day of your first shower.   You may shave your face at any point before/day of surgery.  Place clean sheets on your bed the day you start using CHG soap. Use a clean washcloth (not used since being washed) for each shower. DO NOT sleep with pets once you start using the CHG.   CHG Shower Instructions:  If you choose to wash your hair and private area, wash first with your normal shampoo/soap.  After you use shampoo/soap, rinse your hair and body thoroughly to remove shampoo/soap residue.  Turn the water OFF and apply about 3 tablespoons (45 ml) of CHG soap to a CLEAN washcloth.  Apply CHG soap ONLY FROM YOUR NECK DOWN TO YOUR TOES (washing for 3-5 minutes)  DO NOT use CHG soap on face, private areas, open wounds, or sores.  Pay special attention to the area where your surgery is being  performed.  If you are having back surgery, having someone wash your back for you may be helpful. Wait 2 minutes after CHG soap is applied, then you may rinse off the CHG soap.  Pat dry with a clean towel  Put on clean clothes/pajamas   If you choose to wear lotion, please use ONLY the CHG-compatible lotions on the back of this paper.     Additional instructions for the day of surgery: DO NOT APPLY any lotions, deodorants, cologne, or perfumes.   Put on clean/comfortable clothes.  Brush your teeth.  Ask your nurse before applying any prescription medications to the skin.      CHG Compatible Lotions   Aveeno Moisturizing lotion  Cetaphil Moisturizing Cream  Cetaphil Moisturizing Lotion  Clairol Herbal Essence Moisturizing Lotion, Dry Skin  Clairol Herbal Essence Moisturizing Lotion, Extra Dry Skin  Clairol Herbal Essence Moisturizing Lotion, Normal Skin  Curel Age Defying Therapeutic Moisturizing Lotion with Alpha Hydroxy  Curel Extreme Care Body Lotion  Curel Soothing Hands  Moisturizing Hand Lotion  Curel Therapeutic Moisturizing Cream, Fragrance-Free  Curel Therapeutic Moisturizing Lotion, Fragrance-Free  Curel Therapeutic Moisturizing Lotion, Original Formula  Eucerin Daily Replenishing Lotion  Eucerin Dry Skin Therapy Plus Alpha Hydroxy Crme  Eucerin Dry Skin Therapy Plus Alpha Hydroxy Lotion  Eucerin Original Crme  Eucerin Original Lotion  Eucerin Plus Crme Eucerin Plus Lotion  Eucerin TriLipid Replenishing Lotion  Keri Anti-Bacterial Hand Lotion  Keri Deep Conditioning Original Lotion Dry Skin Formula Softly Scented  Keri Deep Conditioning Original Lotion, Fragrance Free Sensitive Skin Formula  Keri Lotion Fast Absorbing Fragrance Free Sensitive Skin Formula  Keri Lotion Fast Absorbing Softly Scented Dry Skin Formula  Keri Original Lotion  Keri Skin Renewal Lotion Keri Silky Smooth Lotion  Keri Silky Smooth Sensitive Skin Lotion  Nivea Body Creamy Conditioning Oil  Nivea Body Extra Enriched Lotion  Nivea Body Original Lotion  Nivea Body Sheer Moisturizing Lotion Nivea Crme  Nivea Skin Firming Lotion  NutraDerm 30 Skin Lotion  NutraDerm Skin Lotion  NutraDerm Therapeutic Skin Cream  NutraDerm Therapeutic Skin Lotion  ProShield Protective Hand Cream  Provon moisturizing lotion  How to Use an Incentive Spirometer An incentive spirometer is a tool that measures how well you are filling your lungs with each breath. Learning to take long, deep breaths using this tool can help you keep your lungs clear and active. This may help to reverse or lessen your chance of developing breathing (pulmonary) problems, especially infection. You may be asked to use a spirometer: After a surgery. If you have a lung problem or a history of smoking. After a long period of time when you have been unable to move or be active. If the spirometer includes an indicator to show the highest number that you have reached, your health care provider or respiratory therapist  will help you set a goal. Keep a log of your progress as told by your health care provider. What are the risks? Breathing too quickly may cause dizziness or cause you to pass out. Take your time so you do not get dizzy or light-headed. If you are in pain, you may need to take pain medicine before doing incentive spirometry. It is harder to take a deep breath if you are having pain. How to use your incentive spirometer  Sit up on the edge of your bed or on a chair. Hold the incentive spirometer so that it is in an upright position. Before you use the spirometer, breathe out normally. Place  the mouthpiece in your mouth. Make sure your lips are closed tightly around it. Breathe in slowly and as deeply as you can through your mouth, causing the piston or the ball to rise toward the top of the chamber. Hold your breath for 3-5 seconds, or for as long as possible. If the spirometer includes a coach indicator, use this to guide you in breathing. Slow down your breathing if the indicator goes above the marked areas. Remove the mouthpiece from your mouth and breathe out normally. The piston or ball will return to the bottom of the chamber. Rest for a few seconds, then repeat the steps 10 or more times. Take your time and take a few normal breaths between deep breaths so that you do not get dizzy or light-headed. Do this every 1-2 hours when you are awake. If the spirometer includes a goal marker to show the highest number you have reached (best effort), use this as a goal to work toward during each repetition. After each set of 10 deep breaths, cough a few times. This will help to make sure that your lungs are clear. If you have an incision on your chest or abdomen from surgery, place a pillow or a rolled-up towel firmly against the incision when you cough. This can help to reduce pain while taking deep breaths and coughing. General tips When you are able to get out of bed: Walk around often. Continue  to take deep breaths and cough in order to clear your lungs. Keep using the incentive spirometer until your health care provider says it is okay to stop using it. If you have been in the hospital, you may be told to keep using the spirometer at home. Contact a health care provider if: You are having difficulty using the spirometer. You have trouble using the spirometer as often as instructed. Your pain medicine is not giving enough relief for you to use the spirometer as told. You have a fever. Get help right away if: You develop shortness of breath. You develop a cough with bloody mucus from the lungs. You have fluid or blood coming from an incision site after you cough. Summary An incentive spirometer is a tool that can help you learn to take long, deep breaths to keep your lungs clear and active. You may be asked to use a spirometer after a surgery, if you have a lung problem or a history of smoking, or if you have been inactive for a long period of time. Use your incentive spirometer as instructed every 1-2 hours while you are awake. If you have an incision on your chest or abdomen, place a pillow or a rolled-up towel firmly against your incision when you cough. This will help to reduce pain. Get help right away if you have shortness of breath, you cough up bloody mucus, or blood comes from your incision when you cough. This information is not intended to replace advice given to you by your health care provider. Make sure you discuss any questions you have with your health care provider. Document Revised: 11/12/2022 Document Reviewed: 11/12/2022 Elsevier Patient Education  2024 [employer] Inc.    Preoperative Educational Videos for Total Hip, Knee and Shoulder Replacements  To better prepare for surgery, please view our videos that explain the physical activity and discharge planning required to have the best surgical recovery at [facility].  [email or web address]  Questions? Call [phone] or email [email or web address]       [employer]  to address health-related social needs:  [email or web address]

## 2023-10-06 ENCOUNTER — Encounter
Admission: RE | Disposition: A | Discharge status: Home / Self Care | Payer: Self-pay | Source: Home / Self Care | Attending: Surgery

## 2023-10-06 ENCOUNTER — Ambulatory Visit
Admission: RE | Admit: 2023-10-06 | Discharge: 2023-10-06 | Disposition: A | Discharge status: Home / Self Care | Attending: Surgery | Admitting: Surgery

## 2023-10-06 ENCOUNTER — Ambulatory Visit: Payer: Self-pay | Admitting: Urgent Care

## 2023-10-06 ENCOUNTER — Ambulatory Visit

## 2023-10-06 ENCOUNTER — Other Ambulatory Visit: Payer: Self-pay

## 2023-10-06 ENCOUNTER — Encounter: Payer: Self-pay | Admitting: Surgery

## 2023-10-06 ENCOUNTER — Ambulatory Visit: Admitting: Certified Registered"

## 2023-10-06 DIAGNOSIS — Z87891 Personal history of nicotine dependence: Secondary | ICD-10-CM | POA: Diagnosis not present

## 2023-10-06 DIAGNOSIS — K219 Gastro-esophageal reflux disease without esophagitis: Secondary | ICD-10-CM | POA: Diagnosis not present

## 2023-10-06 DIAGNOSIS — M170 Bilateral primary osteoarthritis of knee: Secondary | ICD-10-CM | POA: Insufficient documentation

## 2023-10-06 HISTORY — PX: PARTIAL KNEE ARTHROPLASTY: SHX2174

## 2023-10-06 SURGERY — ARTHROPLASTY, KNEE, UNICOMPARTMENTAL
Anesthesia: Choice | Site: Knee | Laterality: Right

## 2023-10-06 MED ORDER — PHENYLEPHRINE 80 MCG/ML (10ML) SYRINGE FOR IV PUSH (FOR BLOOD PRESSURE SUPPORT)
PREFILLED_SYRINGE | INTRAVENOUS | Status: AC
Start: 2023-10-06 — End: 2023-10-06
  Filled 2023-10-06: qty 10

## 2023-10-06 MED ORDER — BUPIVACAINE LIPOSOME 1.3 % IJ SUSP
INTRAMUSCULAR | Status: AC
  Filled 2023-10-06: qty 20

## 2023-10-06 MED ORDER — SODIUM CHLORIDE (PF) 0.9 % IJ SOLN
INTRAMUSCULAR | Status: AC
  Filled 2023-10-06: qty 20

## 2023-10-06 MED ORDER — FENTANYL CITRATE (PF) 100 MCG/2ML IJ SOLN
INTRAMUSCULAR | Status: AC
  Filled 2023-10-06: qty 2

## 2023-10-06 MED ORDER — PROPOFOL 1000 MG/100ML IV EMUL
INTRAVENOUS | Status: AC
  Filled 2023-10-06: qty 100

## 2023-10-06 MED ORDER — BUPIVACAINE HCL (PF) 0.5 % IJ SOLN
INTRAMUSCULAR | Status: AC
  Filled 2023-10-06: qty 10

## 2023-10-06 MED ORDER — KETOROLAC TROMETHAMINE 15 MG/ML IJ SOLN: 15.0000 mg | Freq: Once | INTRAMUSCULAR | Status: DC

## 2023-10-06 MED ORDER — OXYCODONE HCL 5 MG PO TABS
5.0000 mg | ORAL_TABLET | Freq: Once | ORAL | Status: AC | PRN
  Administered 2023-10-06: 5 mg via ORAL

## 2023-10-06 MED ORDER — CHLORHEXIDINE GLUCONATE 0.12 % MT SOLN
15.0000 mL | Freq: Once | OROMUCOSAL | Status: AC
  Administered 2023-10-06: 15 mL via OROMUCOSAL

## 2023-10-06 MED ORDER — EPHEDRINE SULFATE-NACL 50-0.9 MG/10ML-% IV SOSY
PREFILLED_SYRINGE | INTRAVENOUS | Status: DC | PRN
  Administered 2023-10-06: 5 mg via INTRAVENOUS

## 2023-10-06 MED ORDER — LACTATED RINGERS IV SOLN: INTRAVENOUS | Status: DC

## 2023-10-06 MED ORDER — OXYCODONE HCL 5 MG/5ML PO SOLN: 5.0000 mg | Freq: Once | ORAL | Status: AC | PRN

## 2023-10-06 MED ORDER — METOCLOPRAMIDE HCL 5 MG/ML IJ SOLN: 5.0000 mg | Freq: Three times a day (TID) | INTRAMUSCULAR | Status: DC | PRN

## 2023-10-06 MED ORDER — ORAL CARE MOUTH RINSE: 15.0000 mL | Freq: Once | OROMUCOSAL | Status: AC

## 2023-10-06 MED ORDER — SODIUM CHLORIDE 0.9 % BOLUS PEDS
250.0000 mL | Freq: Once | INTRAVENOUS | Status: AC
  Administered 2023-10-06: 250 mL via INTRAVENOUS

## 2023-10-06 MED ORDER — KETOROLAC TROMETHAMINE 30 MG/ML IJ SOLN
INTRAMUSCULAR | Status: DC | PRN
  Administered 2023-10-06: 30 mg via INTRAMUSCULAR

## 2023-10-06 MED ORDER — ONDANSETRON HCL 4 MG/2ML IJ SOLN
INTRAMUSCULAR | Status: DC | PRN
  Administered 2023-10-06: 4 mg via INTRAVENOUS

## 2023-10-06 MED ORDER — CEFAZOLIN SODIUM-DEXTROSE 2-4 GM/100ML-% IV SOLN
INTRAVENOUS | Status: AC
  Filled 2023-10-06: qty 100

## 2023-10-06 MED ORDER — ONDANSETRON HCL 4 MG PO TABS: 4.0000 mg | ORAL_TABLET | Freq: Four times a day (QID) | ORAL | Status: DC | PRN

## 2023-10-06 MED ORDER — LIDOCAINE HCL (CARDIAC) PF 100 MG/5ML IV SOSY
PREFILLED_SYRINGE | INTRAVENOUS | Status: DC | PRN
  Administered 2023-10-06: 30 mg via INTRAVENOUS

## 2023-10-06 MED ORDER — SODIUM CHLORIDE 0.9 % IV SOLN: INTRAVENOUS | Status: DC

## 2023-10-06 MED ORDER — DEXAMETHASONE SODIUM PHOSPHATE 10 MG/ML IJ SOLN
INTRAMUSCULAR | Status: DC | PRN
  Administered 2023-10-06: 5 mg via INTRAVENOUS

## 2023-10-06 MED ORDER — PROPOFOL 500 MG/50ML IV EMUL
INTRAVENOUS | Status: DC | PRN
  Administered 2023-10-06: 40 ug/kg/min via INTRAVENOUS

## 2023-10-06 MED ORDER — OXYCODONE HCL 5 MG PO TABS
5.0000 mg | ORAL_TABLET | ORAL | Status: DC | PRN
  Administered 2023-10-06: 10 mg via ORAL

## 2023-10-06 MED ORDER — ONDANSETRON HCL 4 MG/2ML IJ SOLN: 4.0000 mg | Freq: Four times a day (QID) | INTRAMUSCULAR | Status: DC | PRN

## 2023-10-06 MED ORDER — PROPOFOL 10 MG/ML IV BOLUS
INTRAVENOUS | Status: AC
  Filled 2023-10-06: qty 20

## 2023-10-06 MED ORDER — BUPIVACAINE-EPINEPHRINE (PF) 0.5% -1:200000 IJ SOLN
INTRAMUSCULAR | Status: AC
  Filled 2023-10-06: qty 30

## 2023-10-06 MED ORDER — BUPIVACAINE-EPINEPHRINE (PF) 0.5% -1:200000 IJ SOLN
INTRAMUSCULAR | Status: DC | PRN
  Administered 2023-10-06: 30 mL

## 2023-10-06 MED ORDER — OXYCODONE HCL 5 MG PO TABS
ORAL_TABLET | ORAL | Status: AC
  Filled 2023-10-06: qty 1

## 2023-10-06 MED ORDER — BUPIVACAINE LIPOSOME 1.3 % IJ SUSP
INTRAMUSCULAR | Status: DC | PRN
  Administered 2023-10-06: 20 mL

## 2023-10-06 MED ORDER — METOCLOPRAMIDE HCL 10 MG PO TABS: 5.0000 mg | ORAL_TABLET | Freq: Three times a day (TID) | ORAL | Status: DC | PRN

## 2023-10-06 MED ORDER — CEFAZOLIN SODIUM-DEXTROSE 2-4 GM/100ML-% IV SOLN
2.0000 g | INTRAVENOUS | Status: AC
Start: 2023-10-06 — End: 2023-10-06
  Administered 2023-10-06: 2 g via INTRAVENOUS

## 2023-10-06 MED ORDER — EPHEDRINE 5 MG/ML INJ
INTRAVENOUS | Status: AC
Start: 2023-10-06 — End: 2023-10-06
  Filled 2023-10-06: qty 5

## 2023-10-06 MED ORDER — BUPIVACAINE HCL (PF) 0.5 % IJ SOLN
INTRAMUSCULAR | Status: DC | PRN
  Administered 2023-10-06: 2.5 mL

## 2023-10-06 MED ORDER — FENTANYL CITRATE (PF) 100 MCG/2ML IJ SOLN
25.0000 ug | INTRAMUSCULAR | Status: DC | PRN
  Administered 2023-10-06: 50 ug via INTRAVENOUS
  Administered 2023-10-06 (×2): 25 ug via INTRAVENOUS

## 2023-10-06 MED ORDER — ACETAMINOPHEN 325 MG PO TABS: 325.0000 mg | ORAL_TABLET | Freq: Four times a day (QID) | ORAL | Status: DC | PRN

## 2023-10-06 MED ORDER — CEFAZOLIN SODIUM-DEXTROSE 2-4 GM/100ML-% IV SOLN
2.0000 g | Freq: Four times a day (QID) | INTRAVENOUS | Status: DC
  Administered 2023-10-06: 2 g via INTRAVENOUS

## 2023-10-06 MED ORDER — FENTANYL CITRATE (PF) 100 MCG/2ML IJ SOLN
INTRAMUSCULAR | Status: DC | PRN
  Administered 2023-10-06 (×4): 25 ug via INTRAVENOUS

## 2023-10-06 MED ORDER — SODIUM CHLORIDE (PF) 0.9 % IJ SOLN
INTRAMUSCULAR | Status: DC | PRN
  Administered 2023-10-06: 10 mL

## 2023-10-06 MED ORDER — LIDOCAINE HCL (PF) 2 % IJ SOLN
INTRAMUSCULAR | Status: AC
  Filled 2023-10-06: qty 5

## 2023-10-06 MED ORDER — OXYCODONE HCL 5 MG PO TABS
ORAL_TABLET | ORAL | Status: AC
  Filled 2023-10-06: qty 2

## 2023-10-06 MED ORDER — PHENYLEPHRINE 80 MCG/ML (10ML) SYRINGE FOR IV PUSH (FOR BLOOD PRESSURE SUPPORT)
PREFILLED_SYRINGE | INTRAVENOUS | Status: DC | PRN
  Administered 2023-10-06: 160 ug via INTRAVENOUS
  Administered 2023-10-06 (×2): 80 ug via INTRAVENOUS

## 2023-10-06 MED ORDER — PROPOFOL 10 MG/ML IV BOLUS
INTRAVENOUS | Status: DC | PRN
  Administered 2023-10-06 (×2): 40 mg via INTRAVENOUS

## 2023-10-06 MED ORDER — MIDAZOLAM HCL 2 MG/2ML IJ SOLN
INTRAMUSCULAR | Status: AC
  Filled 2023-10-06: qty 2

## 2023-10-06 MED ORDER — MIDAZOLAM HCL 5 MG/5ML IJ SOLN
INTRAMUSCULAR | Status: DC | PRN
  Administered 2023-10-06: 2 mg via INTRAVENOUS

## 2023-10-06 MED ORDER — OXYCODONE HCL 5 MG PO TABS: 5.0000 mg | ORAL_TABLET | ORAL | 0 refills | Status: AC | PRN

## 2023-10-06 MED ORDER — APIXABAN 2.5 MG PO TABS: 2.5000 mg | ORAL_TABLET | Freq: Two times a day (BID) | ORAL | 0 refills | Status: AC

## 2023-10-06 MED ORDER — CHLORHEXIDINE GLUCONATE 0.12 % MT SOLN
OROMUCOSAL | Status: AC
Start: 2023-10-06 — End: 2023-10-06
  Filled 2023-10-06: qty 15

## 2023-10-06 MED ORDER — TRIAMCINOLONE ACETONIDE 40 MG/ML IJ SUSP
INTRAMUSCULAR | Status: DC | PRN
  Administered 2023-10-06: 80 mg via INTRAMUSCULAR

## 2023-10-06 MED ORDER — TRIAMCINOLONE ACETONIDE 40 MG/ML IJ SUSP
INTRAMUSCULAR | Status: AC
Start: 2023-10-06 — End: 2023-10-06
  Filled 2023-10-06: qty 2

## 2023-10-06 MED ORDER — TRANEXAMIC ACID-NACL 1000-0.7 MG/100ML-% IV SOLN
INTRAVENOUS | Status: AC
  Filled 2023-10-06: qty 100

## 2023-10-06 MED ORDER — TRANEXAMIC ACID-NACL 1000-0.7 MG/100ML-% IV SOLN
1000.0000 mg | INTRAVENOUS | Status: AC
  Administered 2023-10-06: 1000 mg via INTRAVENOUS

## 2023-10-06 SURGICAL SUPPLY — 48 items
BEARING MENISCAL TIBIAL 5 SM R (Orthopedic Implant) IMPLANT
BNDG ELASTIC 6INX 5YD STR LF (GAUZE/BANDAGES/DRESSINGS) ×1 IMPLANT
CEMENT BONE R 1X40 (Cement) ×1 IMPLANT
CEMENT VACUUM MIXING SYSTEM (MISCELLANEOUS) ×1 IMPLANT
CHLORAPREP W/TINT 26 (MISCELLANEOUS) ×1 IMPLANT
COMPONENT TIBIAL OXFRD MEDL RT (Orthopedic Implant) IMPLANT
COOLER ICEMAN CLASSIC (MISCELLANEOUS) ×1 IMPLANT
COVER MAYO STAND STRL (DRAPES) ×1 IMPLANT
CUFF TRNQT CYL 24X4X16.5-23 (TOURNIQUET CUFF) IMPLANT
CUFF TRNQT CYL 34X4.125X (TOURNIQUET CUFF) IMPLANT
DRAPE C-ARM XRAY 36X54 (DRAPES) ×1 IMPLANT
DRSG MEPILEX SACRM 8.7X9.8 (GAUZE/BANDAGES/DRESSINGS) ×1 IMPLANT
DRSG OPSITE POSTOP 4X6 (GAUZE/BANDAGES/DRESSINGS) ×1 IMPLANT
ELECT CAUTERY BLADE 6.4 (BLADE) ×1 IMPLANT
ELECTRODE REM PT RTRN 9FT ADLT (ELECTROSURGICAL) ×1 IMPLANT
GAUZE XEROFORM 1X8 LF (GAUZE/BANDAGES/DRESSINGS) ×1 IMPLANT
GLOVE BIO SURGEON STRL SZ7.5 (GLOVE) ×4 IMPLANT
GLOVE BIO SURGEON STRL SZ8 (GLOVE) ×4 IMPLANT
GLOVE BIOGEL PI IND STRL 8 (GLOVE) ×1 IMPLANT
GOWN STRL REUS W/ TWL LRG LVL3 (GOWN DISPOSABLE) ×1 IMPLANT
GOWN STRL REUS W/ TWL XL LVL3 (GOWN DISPOSABLE) ×1 IMPLANT
HOOD PEEL AWAY T7 (MISCELLANEOUS) ×3 IMPLANT
KIT TURNOVER KIT A (KITS) ×1 IMPLANT
MANIFOLD NEPTUNE II (INSTRUMENTS) ×1 IMPLANT
MAT ABSORB FLUID 56X50 GRAY (MISCELLANEOUS) ×1 IMPLANT
NDL SAFETY ECLIPSE 18X1.5 (NEEDLE) ×1 IMPLANT
NDL SPNL 20GX3.5 QUINCKE YW (NEEDLE) ×1 IMPLANT
NEEDLE SPNL 20GX3.5 QUINCKE YW (NEEDLE) ×1 IMPLANT
PACK BLADE SAW RECIP 70 3 PT (BLADE) IMPLANT
PACK TOTAL KNEE (MISCELLANEOUS) ×1 IMPLANT
PAD ABD DERMACEA PRESS 5X9 (GAUZE/BANDAGES/DRESSINGS) ×2 IMPLANT
PAD COLD UNI WRAP-ON (PAD) ×1 IMPLANT
PEG FEMORAL PEGGED STRL SM (Knees) IMPLANT
PENCIL SMOKE EVACUATOR (MISCELLANEOUS) ×1 IMPLANT
PIN DRILL HDLS TROCAR 75 4PK (PIN) IMPLANT
SOL .9 NS 3000ML IRR UROMATIC (IV SOLUTION) ×1 IMPLANT
STAPLER SKIN PROX 35W (STAPLE) ×1 IMPLANT
STOCKINETTE IMPERV 14X48 (MISCELLANEOUS) ×1 IMPLANT
STRAP SAFETY 5IN WIDE (MISCELLANEOUS) ×1 IMPLANT
SUCTION TUBE FRAZIER 10FR DISP (SUCTIONS) ×1 IMPLANT
SUT VIC AB 0 CT1 36 (SUTURE) ×1 IMPLANT
SUT VIC AB 2-0 CT1 TAPERPNT 27 (SUTURE) ×4 IMPLANT
SYR 10ML LL (SYRINGE) ×1 IMPLANT
SYR 30ML LL (SYRINGE) ×2 IMPLANT
TAPE TRANSPORE STRL 2 31045 (GAUZE/BANDAGES/DRESSINGS) ×1 IMPLANT
TIP FAN IRRIG PULSAVAC PLUS (DISPOSABLE) ×1 IMPLANT
TRAP FLUID SMOKE EVACUATOR (MISCELLANEOUS) ×1 IMPLANT
WATER STERILE IRR 500ML POUR (IV SOLUTION) ×1 IMPLANT

## 2023-10-06 NOTE — TOC CM/SW Note (Signed)
 Patient is not able to walk the distance required to go the bathroom, or he/she is unable to safely negotiate stairs required to access the bathroom.  A 3in1 BSC will alleviate this problem

## 2023-10-06 NOTE — Discharge Instructions (Addendum)
 Orthopedic discharge instructions: May shower with intact OpSite dressing. Apply ice frequently to knee or use Polar Care. Start Eliquis  1 tablet (2.5 mg) twice daily on Friday, 10/07/2023, for 2 weeks, then take aspirin 325 mg (or 4 baby aspirin) twice daily for 4 weeks. Take pain medication as prescribed when needed.  May supplement with ES Tylenol  if necessary. May weight-bear as tolerated on right leg - use walker for balance and support. Follow-up in 10-14 days or as scheduled.POLAR CARE INFORMATION  MassAdvertisement.it  How to use [employer] Polar Care [facility]?  YouTube   ShippingScam.co.uk  OPERATING INSTRUCTIONS  Start the product With dry hands, connect the transformer to the electrical connection located on the top of the cooler. Next, plug the transformer into an appropriate electrical outlet. The unit will automatically start running at this point.  To stop the pump, disconnect electrical power.  Unplug to stop the product when not in use. Unplugging the Polar Care unit turns it off. Always unplug immediately after use. Never leave it plugged in while unattended. Remove pad.    FIRST ADD WATER TO FILL LINE, THEN ICE---Replace ice when existing ice is almost melted  1 Discuss Treatment with your Licensed Health Care Practitioner and Use Only as Prescribed 2 Apply Insulation Barrier & Cold Therapy Pad 3 Check for Moisture 4 Inspect Skin Regularly  Tips and Trouble Shooting Usage Tips 1. Use cubed or chunked ice for optimal performance. 2. It is recommended to drain the Pad between uses. To drain the pad, hold the Pad upright with the hose pointed toward the ground. Depress the black plunger and allow water to drain out. 3. You may disconnect the Pad from the unit without removing the pad from the affected area by depressing the silver tabs on the hose coupling and gently pulling the hoses apart. The Pad and unit will seal itself and will not  leak. Note: Some dripping during release is normal. 4. DO NOT RUN PUMP WITHOUT WATER! The pump in this unit is designed to run with water. Running the unit without water will cause permanent damage to the pump. 5. Unplug unit before removing lid.  TROUBLESHOOTING GUIDE Pump not running, Water not flowing to the pad, Pad is not getting cold 1. Make sure the transformer is plugged into the wall outlet. 2. Confirm that the ice and water are filled to the indicated levels. 3. Make sure there are no kinks in the pad. 4. Gently pull on the blue tube to make sure the tube/pad junction is straight. 5. Remove the pad from the treatment site and ll it while the pad is lying at; then reapply. 6. Confirm that the pad couplings are securely attached to the unit. Listen for the double clicks (Figure 1) to confirm the pad couplings are securely attached.  Leaks    Note: Some condensation on the lines, controller, and pads is unavoidable, especially in warmer climates. 1. If using a Breg Polar Care Cold Therapy unit with a detachable Cold Therapy Pad, and a leak exists (other than condensation on the lines) disconnect the pad couplings. Make sure the silver tabs on the couplings are depressed before reconnecting the pad to the pump hose; then confirm both sides of the coupling are properly clicked in. 2. If the coupling continues to leak or a leak is detected in the pad itself, stop using it and call Breg Customer Care at [phone].  Cleaning After use, empty and dry the unit with a  soft cloth. Warm water and mild detergent may be used occasionally to clean the pump and tubes.  WARNING: The Polar Care Cube can be cold enough to cause serious injury, including full skin necrosis. Follow these Operating Instructions, and carefully read the Product Insert (see pouch on side of unit) and the Cold Therapy Pad Fitting Instructions (provided with each Cold Therapy Pad) prior to use.

## 2023-10-06 NOTE — Evaluation (Signed)
 Physical Therapy Evaluation Patient Details Name: Sonia Huang MRN: [id number] DOB: [birth year] Today's Date: 10/06/2023  History of Present Illness  Pt is a 78 y/o F s/p R unicondylar knee arthroplasty on 9/18. PMH significant for anxiety, skin cancer, depression, HLD, GERD  Clinical Impression  Pt A&Ox4, pleasant and agreeable to PT evaluation. Pt denied pain throughout session. At baseline, pt is IND with mobility/ADLs, no previous AD use and denies hx of falls. Pt was met long-sitting in bed, able to transfer to sitting EOB with supervision. STS from EOB and toilet with supervision and VC for hand placement. Pt able to void, perform pericare independently, and stand at sink to wash hands with steady balance. Pt amb ~160ft total with RW and CGA, no LOB, VC for RW positioning throughout. Pt able to navigate 4 stairs twice with CGA and BUE support on R hand rail, VC for step sequencing (step-to pattern) and for ascending/descending both forward and sideways. Pt was left long-sitting in bed, all needs in reach, friend at bedside. Pt would benefit from skilled PT intervention to address listed deficits (see PT Problem List) and allow for safe return to PLOF.       If plan is discharge home, recommend the following: A little help with walking and/or transfers;A little help with bathing/dressing/bathroom;Assistance with cooking/housework;Assist for transportation;Help with stairs or ramp for entrance   Can travel by private vehicle        Equipment Recommendations BSC/3in1  Recommendations for Other Services       Functional Status Assessment Patient has had a recent decline in their functional status and demonstrates the ability to make significant improvements in function in a reasonable and predictable amount of time.     Precautions / Restrictions Precautions Precautions: Knee;Fall Precaution Booklet Issued: Yes (comment) Recall of Precautions/Restrictions: Intact Restrictions Weight  Bearing Restrictions Per Provider Order: Yes RLE Weight Bearing Per Provider Order: Weight bearing as tolerated      Mobility  Bed Mobility Overal bed mobility: Modified Independent             General bed mobility comments: no physical assistance for bed mobility    Transfers Overall transfer level: Needs assistance Equipment used: Rolling walker (2 wheels) Transfers: Sit to/from Stand Sit to Stand: Supervision           General transfer comment: STS from EOB and toilet with RW and supervision, VC for hand placement    Ambulation/Gait Ambulation/Gait assistance: Contact guard assist Gait Distance (Feet): 100 Feet Assistive device: Rolling walker (2 wheels) Gait Pattern/deviations: Step-through pattern, Decreased stance time - right, Wide base of support Gait velocity: decreased     General Gait Details: no LOB, mild deceased WB tolerance on RLE, VC for RW positioning  Stairs Stairs: Yes Stairs assistance: Contact guard assist Stair Management: One rail Right, Step to pattern, Forwards, Sideways Number of Stairs: 8 General stair comments: pt able to navigate 4 stairs twice with BUE support on R hand rail. Initial navigation forward, second attempt navigated sideways. VC for step sequencing  Wheelchair Mobility     Tilt Bed    Modified Rankin (Stroke Patients Only)       Balance Overall balance assessment: Needs assistance Sitting-balance support: Feet supported Sitting balance-Leahy Scale: Good Sitting balance - Comments: steady static and dynamic sitting   Standing balance support: Bilateral upper extremity supported, No upper extremity supported Standing balance-Leahy Scale: Good Standing balance comment: able to stand to wash hands at sink with steady balance  Pertinent Vitals/Pain Pain Assessment Pain Assessment: No/denies pain    Home Living Family/patient expects to be discharged to:: Private  residence Living Arrangements: Spouse/significant other Available Help at Discharge: Friend(s);Available 24 hours/day;Family Type of Home: House Home Access: Stairs to enter Entrance Stairs-Rails: Doctor, general practice of Steps: 6   Home Layout: One level Home Equipment: Agricultural consultant (2 wheels);Cane - single point Additional Comments: pt's friend is staying with her for the first few days 24/7 to assist.    Prior Function Prior Level of Function : Independent/Modified Independent             Mobility Comments: pt reports being IND with mobility, no previous AD use, denies falls ADLs Comments: IND with ADLs     Extremity/Trunk Assessment   Upper Extremity Assessment Upper Extremity Assessment: Overall WFL for tasks assessed    Lower Extremity Assessment Lower Extremity Assessment: Generalized weakness       Communication   Communication Communication: No apparent difficulties    Cognition Arousal: Alert Behavior During Therapy: WFL for tasks assessed/performed   PT - Cognitive impairments: No apparent impairments                       PT - Cognition Comments: A&Ox4 Following commands: Intact       Cueing Cueing Techniques: Verbal cues, Visual cues     General Comments      Exercises     Assessment/Plan    PT Assessment Patient needs continued PT services  PT Problem List Decreased strength;Decreased range of motion;Decreased activity tolerance;Decreased balance;Decreased mobility;Decreased knowledge of use of DME;Pain       PT Treatment Interventions DME instruction;Gait training;Stair training;Functional mobility training;Therapeutic activities;Therapeutic exercise;Balance training;Neuromuscular re-education;Patient/family education    PT Goals (Current goals can be found in the Care Plan section)  Acute Rehab PT Goals Patient Stated Goal: to go home PT Goal Formulation: With patient Time For Goal Achievement:  10/20/23 Potential to Achieve Goals: Good    Frequency BID     Co-evaluation               AM-PAC PT 6 Clicks Mobility  Outcome Measure Help needed turning from your back to your side while in a flat bed without using bedrails?: None Help needed moving from lying on your back to sitting on the side of a flat bed without using bedrails?: A Little Help needed moving to and from a bed to a chair (including a wheelchair)?: A Little Help needed standing up from a chair using your arms (e.g., wheelchair or bedside chair)?: None Help needed to walk in hospital room?: A Little Help needed climbing 3-5 steps with a railing? : A Little 6 Click Score: 20    End of Session Equipment Utilized During Treatment: Gait belt Activity Tolerance: Patient tolerated treatment well Patient left: in bed;with call bell/phone within reach;with family/visitor present Nurse Communication: Mobility status PT Visit Diagnosis: Other abnormalities of gait and mobility (R26.89);Muscle weakness (generalized) (M62.81);Difficulty in walking, not elsewhere classified (R26.2);Pain Pain - Right/Left: Right Pain - part of body: Knee    Time: 1420-1449 PT Time Calculation (min) (ACUTE ONLY): 29 min   Charges:   PT Evaluation $PT Eval Low Complexity: 1 Low PT Treatments $Therapeutic Activity: 23-37 mins PT General Charges $$ ACUTE PT VISIT: 1 Visit         Janell Axe, SPT

## 2023-10-06 NOTE — Anesthesia Procedure Notes (Signed)
 Spinal  Patient location during procedure: OR Start time: 10/06/2023 7:40 AM End time: 10/06/2023 7:42 AM Reason for block: surgical anesthesia Staffing Performed: resident/CRNA  Resident/CRNA: Niki Manus SAUNDERS, CRNA Performed by: Niki Manus SAUNDERS, CRNA Authorized by: Leavy Ned, MD   Preanesthetic Checklist Completed: patient identified, IV checked, site marked, risks and benefits discussed, surgical consent, monitors and equipment checked, pre-op evaluation and timeout performed Spinal Block Patient position: sitting Prep: ChloraPrep Patient monitoring: heart rate, continuous pulse ox, blood pressure and cardiac monitor Approach: midline Location: L4-5 Injection technique: single-shot Needle Needle type: Introducer and Pencan  Needle gauge: 24 G Needle length: 10 cm Assessment Sensory level: T6 Events: CSF return Additional Notes Negative paresthesia. Negative blood return. Positive free-flowing CSF. Expiration date of kit checked and confirmed. Patient tolerated procedure well, without complications.

## 2023-10-06 NOTE — Anesthesia Preprocedure Evaluation (Signed)
 Anesthesia Evaluation  Patient identified by MRN, date of birth, ID band Patient awake    Reviewed: Allergy & Precautions, NPO status , Patient's Chart, lab work & pertinent test results  Airway Mallampati: III  TM Distance: >3 FB Neck ROM: full    Dental  (+) Chipped   Pulmonary neg pulmonary ROS, former smoker   Pulmonary exam normal        Cardiovascular negative cardio ROS Normal cardiovascular exam     Neuro/Psych  PSYCHIATRIC DISORDERS Anxiety     negative neurological ROS     GI/Hepatic Neg liver ROS,GERD  Medicated,,  Endo/Other  negative endocrine ROS    Renal/GU      Musculoskeletal   Abdominal   Peds  Hematology negative hematology ROS (+)   Anesthesia Other Findings Past Medical History: No date: Alcohol consumption of more than two drinks per day No date: Anxiety 01/19/2004: Carpal tunnel syndrome No date: Cataract No date: Family history of adverse reaction to anesthesia     Comment:  sister-N/V No date: GERD (gastroesophageal reflux disease) 01/19/2008: Hx of abscess of breast No date: Hx of cystitis 01/19/2008: Lump or mass in breast     Comment:  LEFT  No date: Osteoarthritis of right knee No date: Personal history of tobacco use, presenting hazards to health No date: Rectocele No date: Screening for obesity No date: Special screening for malignant neoplasms, colon 01/18/2010: Thyroid  disease     Comment:  nontoxic uninodular goiter No date: TMJ (dislocation of temporomandibular joint)  Past Surgical History: 2006: CARPAL TUNNEL RELEASE; Right 2000: CHOLECYSTECTOMY 2011: COLONOSCOPY 1983: DILATION AND CURETTAGE OF UTERUS 2010: INCISION AND DRAINAGE BREAST ABSCESS; Left 2010: TUBAL LIGATION     Reproductive/Obstetrics negative OB ROS                              Anesthesia Physical Anesthesia Plan  ASA: 2  Anesthesia Plan: General/Spinal   Post-op  Pain Management:    Induction: Intravenous  PONV Risk Score and Plan: 2 and Ondansetron , Dexamethasone , Propofol  infusion, TIVA and Midazolam   Airway Management Planned: Natural Airway and Nasal Cannula  Additional Equipment:   Intra-op Plan:   Post-operative Plan:   Informed Consent: I have reviewed the patients History and Physical, chart, labs and discussed the procedure including the risks, benefits and alternatives for the proposed anesthesia with the patient or authorized representative who has indicated his/her understanding and acceptance.     Dental Advisory Given  Plan Discussed with: Anesthesiologist, CRNA and Surgeon  Anesthesia Plan Comments: (Patient reports no bleeding problems and no anticoagulant use.  Plan for spinal with backup GA  Patient consented for risks of anesthesia including but not limited to:  - adverse reactions to medications - damage to eyes, teeth, lips or other oral mucosa - nerve damage due to positioning  - risk of bleeding, infection and or nerve damage from spinal that could lead to paralysis - risk of headache or failed spinal - damage to teeth, lips or other oral mucosa - sore throat or hoarseness - damage to heart, brain, nerves, lungs, other parts of body or loss of life  Patient voiced understanding and assent.)        Anesthesia Quick Evaluation

## 2023-10-06 NOTE — H&P (Signed)
 History of Present Illness:  Sonia Huang is a 78 y.o. female who has severe Right knee pain. Patient reports a longstanding history of approximately 4 years of bilateral knee pain, worse notably on her right side. She was found to have severe osteoarthritis involving her medial compartments of her bilateral knees with her right side slightly more afflicted than her left. She had seen both Norleen Geralds and Krystal Doyne PA-Cs for this issue in the past and received various intra-articular corticosteroid injections, topical and oral interventions without much benefit. She states that her pain is made noticeably worse with any prolonged standing, ambulation or increase in activity level. It greatly impacts her ability to perform her ADLs and walk long distances as she would like. She is not currently utilizing any ambulatory assistive devices. No numbness, tingling or radiation symptoms. Again localizes most of this pain along the medial aspect and describes it as a dull achy pain that can sometimes turn into a sharp and stabbing pain. She has requested operative intervention for relief of her DJD symptoms. Patient denies any previous cardiac or pulmonary issues. No previous DVTs or clots. She is not a diabetic. Denies any previous surgeries on this knee.  Social Hx: Patient is retired and lives at home with her husband who will be helping along with one of her friends to look after her in surgery. She does report that she has about 2-3 standard drinks of alcohol a night. She denies any illicit drug use. No nicotine use or smoking.  Allergies:  Venom-White-Faced Hornet Anaphylaxis  Wasp Venom Anaphylaxis  Yellow Jacket Venom Anaphylaxis   Medicines: aspirin 81 MG EC tablet Take 1 tablet (81 mg total) by mouth once daily  atorvastatin (LIPITOR) 80 MG tablet TAKE 1 TABLET(80 MG) BY MOUTH DAILY 90 tablet 2  buPROPion (WELLBUTRIN) 75 MG tablet Take 1 tablet (75 mg total) by mouth once daily (Patient not taking:  Reported on 08/08/2023) 90 tablet 3  calcium carbonate-vitamin D3 (OS-CAL 500+D) 500 mg(1,250mg ) -200 unit tablet Take 1 tablet by mouth once daily  clobetasoL (CORMAX) 0.05 % external solution  cyanocobalamin (VITAMIN B12) 1000 MCG tablet Take 5,000 mcg by mouth once daily  diphenhydramine  HCl (BENADRYL  ORAL) Take by mouth every 8 (eight) hours as needed  escitalopram oxalate (LEXAPRO) 20 MG tablet TAKE 1 TABLET(20 MG) BY MOUTH EVERY DAY 30 tablet 11  ketoconazole (NIZORAL) 2 % shampoo USE AS SHAMPOO EVERY OTHER DAY. LEAVE ON FOR 5 MINUTES BEFORE RINSING  multivitamin tablet Take 1 tablet by mouth once daily  triamcinolone  0.1 % ointment APPLY TO THE AFFECTED AREA OF SCALP TWICE DAILY UNTIL CLEAR AND THEN USE AS NEEDED  TURMERIC ORAL Take 1 capsule by mouth once daily (Patient not taking: Reported on 07/21/2023)   Past Medical History:  Anxiety  Cancer (CMS/HHS-HCC) - skin cancer ([facility] - not melanoma)  Cystic breast  Depression  Hyperlipidemia   Past Surgical History:  COLONOSCOPY 07/03/2019 (Diverticulosis/Otherwise normal/No Repeat due to age/TKT)  CARPAL TUNNEL RELEASE  CHOLECYSTECTOMY  COLONOSCOPY  DILATION AND CURETTAGE OF UTERUS  TUBAL LIGATION    Physical Exam: Ht:160 cm (5' 3) Wt:74.8 kg (165 lb) BMI: Body mass index is 29.23 kg/m.  General/Constitutional: No apparent distress: well-nourished and well developed. Eyes: Pupils equal, round with synchronous movement. Lymphatic: No palpable adenopathy. Respiratory: Patient has good chest rise and fall with inspiration and expiration. All lung fields are clear to auscultation bilaterally. There is no Rales, rhonchi or wheezes appreciated. Cardiovascular: Upon auscultation there  is a regular rate and rhythm without any murmurs, rubs, gallops or heaves appreciated. There does not appear to be any swelling down the lower extremities. Posterior tibial pulses appreciated bilaterally, 2+. Integumentary: No impressive  skin lesions present, except as noted in detailed exam. Neuro/Psych: Normal mood and affect, oriented to person, place and time. Musculoskeletal: see exam below  Right knee exam: Upon inspection of the patient's right knee there does not appear to be any skin changes, open abrasions, or redness. Mild swelling appreciated there is a slight varus alignment. Upon palpation, the patient reports having pain along the medial aspect of their knee. Patient has full extension actively with ROM, and able to flex back to 117 degrees with mild pain. Varus and valgus stress testing shows positive pseudolaxity to valgus stressing. The patella tracks well within the femoral groove from flexion into extension with mild crepitus appreciated. Anterior and posterior drawer testing negative. Patient is neurovascularly intact down their lower extremity to all dermatomes. Posterior tibial pulses appreciated 2+.  Imaging: Previous images from 07/21/2023 were reviewed. Upon inspection of the patient's right knee, there appears to be noticeable joint space closure along the medial cartilage space with well-preserved spacing along the lateral cartilage space. Subchondral sclerosis is appreciated. Small osteophytes present as well. No fractures, lytic lesions or gross deformities appreciated on films.  Assessment:  1. Primary osteoarthritis of right knee.   Plan: The treatment options were discussed with the patient. In addition, patient educational materials were provided regarding the diagnosis and treatment options. The patient is quite frustrated by her symptoms and functional limitations, especially as a pertains to her right knee, and is ready to consider more aggressive treatment options. Therefore, I have recommended a surgical procedure, specifically a right partial knee replacement. The procedure was discussed with the patient, as were the potential risks (including bleeding, infection, nerve and/or blood vessel injury,  persistent or recurrent pain, loosening and/or failure of the components, dislocation, need for further surgery, blood clots, strokes, heart attacks and/or arhythmias, pneumonia, etc.) and benefits. The patient states his/her understanding and wishes to proceed. All of the patient's questions and concerns were answered. She can call any time with further concerns. She will follow up post-surgery, routine.     H&P reviewed and patient re-examined. No changes.

## 2023-10-06 NOTE — Op Note (Signed)
 10/06/2023  9:45 AM  Patient:   Sonia Huang  Pre-Op Diagnosis:   Osteoarthritis of medial compartment, right knee.  Post-Op Diagnosis:   Same  Procedure:   Right unicondylar knee arthroplasty.  Surgeon:   DOROTHA Reyes Maltos, MD  Assistant:   Gustavo Level, PA-C; Dozier Cuba, PA-S  Anesthesia:   Spinal  Findings:   As above.  Complications:   None  EBL:   5 cc  Fluids:   400 cc crystalloid  UOP:   None  TT:   80 minutes at 300 mmHg  Drains:   None  Closure:   Staples  Implants:   All-cemented Biomet Oxford system with a small femoral component, a B sized tibial tray, and a 5 mm meniscal bearing insert.  Brief Clinical Note:   The patient is a 78 year old female with a history of progressive worsening medial sided right knee pain. Her symptoms have progressed despite medications, activity modification, etc. Her history and examination consistent with advanced degenerative joint disease primarily limited to the medial compartment as confirmed by plain radiographs. The patient presents at this time for a right partial knee replacement.  Procedure:   The patient was brought into the operating room and a spinal placed by the anesthesiologist. The patient was lain in the supine position, then repositioned so that the non-surgical leg was placed in a flexed and abducted position in the yellow fin leg holder while the surgical extremity was placed over the Biomet leg holder. The right lower extremity was prepped with ChloraPrep solution before being draped sterilely. Preoperative antibiotics were administered. After performing a timeout to verify the appropriate surgical site, the limb was exsanguinated with an Esmarch and the tourniquet inflated to 300 mmHg.   A standard anterior approach to the knee was made through an approximately 3.5-4 inch incision. The incision was carried down through the subcutaneous tissues to expose the superficial retinaculum. This was split the  length the incision and the medial flap elevated sufficiently to expose the medial retinaculum. The medial retinaculum was incised along the medial border of the patella tendon and extended proximally along the medial border of the patella, leaving a 3-4 mm cuff of tissue. The soft tissues were elevated off the anteromedial aspect of the proximal tibia. The anterior portion of the meniscus was removed after performing a subtotal excision of the infrapatellar fat pad. The anterior cruciate ligament was inspected and found to be in excellent condition. Osteophytes were removed from the inferior pole of the patella as well as from the notch using a quarter-inch osteotome. There were significant degenerative changes of both the femur and tibia on the medial side.   The medial femoral condyle was sized using the small and medium sizers. It was felt that the small guide best optimized the contour of the femur. This was left in place and the external tibial guide positioned. The coupling device was used to connect the guide to the medial femoral condylar sizer to optimize appropriate orientation. Two guide pins were inserted into the cutting block before the coupling device and sizer were removed. The appropriate tibial cut was made using the oscillating and reciprocating saws. The piece was removed in its entirety and taken to the back table where it was sized and found to be optimally replicated by a B sized component. The 9 mm spacer was inserted to verify that sufficient bone had been removed.  Attention was directed to femoral side. The intramedullary canal was accessed through a 4 mm  drill hole. The intramedullary guide was positioned before the guide for the femoral condylar holes was positioned. The appropriate coupling device connected this guide to the intramedullary guide before both drill holes were created in the distal aspect of the medial femoral condyle. The devices were removed and the posterior  condylar cutting block inserted. The appropriate cut was made using the reciprocating saw and this piece removed. The #0 spigot was inserted and the initial bone milling performed. A trial femoral component was inserted and both the flexion and extension gaps measured. In flexion, the gap measured 8 mm whereas in extension, it measured 7 mm. Therefore, the #1 spigot was selected and the secondary bone milling performed. Repeat sizing demonstrated symmetric flexion and extension gaps. The bone was removed from the postero-medial and postero-lateral aspects of the femoral condyle, as well as from the beneath the collar of the spigot. Bone also was removed from the anterior portion of the femur so as to minimize any potential impingement with the meniscal bearing insert. The trial components removed and several drill holes placed into the distal femoral condyle to further augment cement fixation.  Attention was redirected to the tibial side. The B sized tibial tray was positioned and temporarily secured using the appropriate spiked nail. The keel was created using the bi-bladed reciprocating saw and hoe. The keeled B sized trial tibial tray was inserted to be sure that it seated properly. At this point, a total of 20 cc of Exparel , 2 cc of Kenalog  40 (80 mg), 30 mg of Toradol , and 30 cc of 0.5% Sensorcaine  diluted out to 60 cc with normal saline was injected in and around the posterior and medial capsular tissues, as well as the peri-incisional tissues to help with postoperative pain control.  The bony surfaces were prepared for cementing by irrigating them thoroughly with bacitracin saline solution using the jet lavage system before packing them with a dry Ray-Tec sponge. Meanwhile, cement was being mixed on the back table. When the cement was ready, the tibial tray was cemented in first. The excess cement was removed using a Public house manager after impacting it into place. Next, the femoral component was  impacted into place. Again the excess cement was removed using a Public house manager. The 5 mm spacer was inserted and the knee brought into near full extension while the cement hardened. Once the cement hardened, the spacer was removed and the 4 mm and 5 mm meniscal bearing inserts were trialed. The 5 mm insert demonstrated excellent tracking while the knee was placed through a range of motion, and showed no evidence towards subluxation or dislocation. In addition, it did not fit too tightly. Therefore, the permanent 5 mm meniscal bearing insert was snapped into position after verifying that no cement had been retained posteriorly. Again the knee was placed through a range of motion with the findings as described above.  The wound was copiously irrigated with sterile saline solution via the jet lavage system before the retinacular layer was reapproximated using #0 Vicryl interrupted sutures. The subcutaneous tissues were closed in two layers using 2-0 Vicryl interrupted sutures before the skin was closed using staples. A sterile occlusive dressing was applied to the knee before the patient was awakened. The patient was transferred back to her hospital bed and returned to the recovery room in satisfactory condition after tolerating the procedure well. A Polar Care device was applied to the knee as well.

## 2023-10-06 NOTE — Transfer of Care (Signed)
 Immediate Anesthesia Transfer of Care Note  Patient: Sonia Huang  Procedure(s) Performed: ARTHROPLASTY, KNEE, UNICOMPARTMENTAL (Right: Knee)  Patient Location: PACU  Anesthesia Type:Spinal  Level of Consciousness: awake and alert   Airway & Oxygen Therapy: Patient Spontanous Breathing and Patient connected to nasal cannula oxygen  Post-op Assessment: Report given to RN and Post -op Vital signs reviewed and stable  Post vital signs: Reviewed and stable  Last Vitals:  Vitals Value Taken Time  BP 122/45 10/06/23 09:49  Temp    Pulse 66 10/06/23 09:53  Resp 13 10/06/23 09:53  SpO2 92 % 10/06/23 09:53  Vitals shown include unfiled device data.  Last Pain:  Vitals:   10/06/23 9367  TempSrc: Temporal  PainSc: 0-No pain         Complications: No notable events documented.

## 2023-10-07 ENCOUNTER — Encounter: Payer: Self-pay | Admitting: Surgery

## 2023-10-10 NOTE — Anesthesia Postprocedure Evaluation (Signed)
 Anesthesia Post Note  Patient: Sonia Huang  Procedure(s) Performed: ARTHROPLASTY, KNEE, UNICOMPARTMENTAL (Right: Knee)  Patient location during evaluation: Nursing Unit Anesthesia Type: Combined General/Spinal Level of consciousness: awake and alert Pain management: pain level controlled Vital Signs Assessment: post-procedure vital signs reviewed and stable Respiratory status: spontaneous breathing, nonlabored ventilation, respiratory function stable and patient connected to nasal cannula oxygen Cardiovascular status: blood pressure returned to baseline and stable Postop Assessment: no apparent nausea or vomiting Anesthetic complications: no   No notable events documented.   Last Vitals:  Vitals:   10/06/23 1119 10/06/23 1306  BP: (!) 137/49 128/61  Pulse: 69 77  Resp: 12 16  Temp: 36.5 C   SpO2: 92% 92%    Last Pain:  Vitals:   10/06/23 1338  TempSrc:   PainSc: 2                  Debby Mines

## 2024-01-06 ENCOUNTER — Other Ambulatory Visit: Payer: Self-pay | Admitting: Student

## 2024-01-06 DIAGNOSIS — M47816 Spondylosis without myelopathy or radiculopathy, lumbar region: Secondary | ICD-10-CM

## 2024-01-06 DIAGNOSIS — M4807 Spinal stenosis, lumbosacral region: Secondary | ICD-10-CM

## 2024-01-06 DIAGNOSIS — M5416 Radiculopathy, lumbar region: Secondary | ICD-10-CM

## 2024-01-08 ENCOUNTER — Ambulatory Visit
Admission: RE | Admit: 2024-01-08 | Discharge: 2024-01-08 | Disposition: A | Discharge status: Home / Self Care | Source: Ambulatory Visit | Attending: Student | Admitting: Student

## 2024-01-08 DIAGNOSIS — M5416 Radiculopathy, lumbar region: Secondary | ICD-10-CM | POA: Insufficient documentation

## 2024-01-08 DIAGNOSIS — M47816 Spondylosis without myelopathy or radiculopathy, lumbar region: Secondary | ICD-10-CM | POA: Diagnosis present

## 2024-01-08 DIAGNOSIS — M4807 Spinal stenosis, lumbosacral region: Secondary | ICD-10-CM | POA: Insufficient documentation
